# Patient Record
Sex: Female | Born: 1985 | Race: White | Hispanic: No | Marital: Married | Smoking: Former smoker
Health system: Southern US, Community
[De-identification: ages and names within clinical notes are randomized; demographics above are authoritative.]

## PROBLEM LIST (undated history)

## (undated) DIAGNOSIS — R51 Headache: Secondary | ICD-10-CM

## (undated) DIAGNOSIS — M545 Low back pain, unspecified: Secondary | ICD-10-CM

## (undated) DIAGNOSIS — K900 Celiac disease: Secondary | ICD-10-CM

## (undated) DIAGNOSIS — F329 Major depressive disorder, single episode, unspecified: Secondary | ICD-10-CM

## (undated) DIAGNOSIS — M539 Dorsopathy, unspecified: Secondary | ICD-10-CM

## (undated) DIAGNOSIS — M549 Dorsalgia, unspecified: Secondary | ICD-10-CM

## (undated) DIAGNOSIS — M5430 Sciatica, unspecified side: Secondary | ICD-10-CM

## (undated) DIAGNOSIS — Z8632 Personal history of gestational diabetes: Secondary | ICD-10-CM

## (undated) DIAGNOSIS — F419 Anxiety disorder, unspecified: Secondary | ICD-10-CM

## (undated) DIAGNOSIS — F32A Depression, unspecified: Secondary | ICD-10-CM

## (undated) DIAGNOSIS — G43909 Migraine, unspecified, not intractable, without status migrainosus: Secondary | ICD-10-CM

## (undated) DIAGNOSIS — E039 Hypothyroidism, unspecified: Secondary | ICD-10-CM

## (undated) DIAGNOSIS — S46009A Unspecified injury of muscle(s) and tendon(s) of the rotator cuff of unspecified shoulder, initial encounter: Secondary | ICD-10-CM

## (undated) DIAGNOSIS — F1910 Other psychoactive substance abuse, uncomplicated: Secondary | ICD-10-CM

## (undated) DIAGNOSIS — J45909 Unspecified asthma, uncomplicated: Secondary | ICD-10-CM

## (undated) DIAGNOSIS — M654 Radial styloid tenosynovitis [de Quervain]: Secondary | ICD-10-CM

## (undated) DIAGNOSIS — M1990 Unspecified osteoarthritis, unspecified site: Secondary | ICD-10-CM

## (undated) HISTORY — DX: Sciatica, unspecified side: M54.30

## (undated) HISTORY — DX: Celiac disease: K90.0

## (undated) HISTORY — PX: IUD REMOVAL: SHX5392

## (undated) HISTORY — DX: Low back pain, unspecified: M54.50

## (undated) HISTORY — DX: Major depressive disorder, single episode, unspecified: F32.9

## (undated) HISTORY — DX: Dorsopathy, unspecified: M53.9

## (undated) HISTORY — DX: Dorsalgia, unspecified: M54.9

## (undated) HISTORY — DX: Unspecified injury of muscle(s) and tendon(s) of the rotator cuff of unspecified shoulder, initial encounter: S46.009A

## (undated) HISTORY — DX: Migraine, unspecified, not intractable, without status migrainosus: G43.909

## (undated) HISTORY — DX: Radial styloid tenosynovitis (de quervain): M65.4

## (undated) HISTORY — DX: Other psychoactive substance abuse, uncomplicated: F19.10

## (undated) HISTORY — DX: Unspecified osteoarthritis, unspecified site: M19.90

## (undated) HISTORY — DX: Depression, unspecified: F32.A

## (undated) HISTORY — DX: Personal history of gestational diabetes: Z86.32

## (undated) HISTORY — PX: WISDOM TOOTH EXTRACTION: SHX21

## (undated) HISTORY — PX: WRIST SURGERY: SHX841

---

## 2012-07-27 LAB — OB RESULTS CONSOLE GBS: GBS: NEGATIVE

## 2012-07-27 LAB — OB RESULTS CONSOLE HIV ANTIBODY (ROUTINE TESTING): HIV: NONREACTIVE

## 2012-07-27 LAB — OB RESULTS CONSOLE ABO/RH: RH Type: POSITIVE

## 2012-08-24 ENCOUNTER — Inpatient Hospital Stay (HOSPITAL_COMMUNITY): Admission: AD | Admit: 2012-08-24 | Payer: Self-pay | Source: Ambulatory Visit | Admitting: Obstetrics and Gynecology

## 2012-09-16 ENCOUNTER — Other Ambulatory Visit: Payer: Self-pay | Admitting: Obstetrics and Gynecology

## 2012-11-02 ENCOUNTER — Encounter (HOSPITAL_COMMUNITY): Payer: Self-pay | Admitting: Pharmacist

## 2012-11-11 ENCOUNTER — Encounter (HOSPITAL_COMMUNITY): Payer: Self-pay

## 2012-11-14 ENCOUNTER — Encounter (HOSPITAL_COMMUNITY)
Admission: RE | Admit: 2012-11-14 | Discharge: 2012-11-14 | Disposition: A | Discharge status: Home / Self Care | Payer: No Typology Code available for payment source | Source: Ambulatory Visit | Attending: Obstetrics and Gynecology | Admitting: Obstetrics and Gynecology

## 2012-11-14 ENCOUNTER — Inpatient Hospital Stay (HOSPITAL_COMMUNITY)
Admission: RE | Admit: 2012-11-14 | Discharge: 2012-11-18 | DRG: 766 | Disposition: A | Discharge status: Home / Self Care | Payer: No Typology Code available for payment source | Source: Ambulatory Visit | Attending: Obstetrics and Gynecology | Admitting: Obstetrics and Gynecology

## 2012-11-14 ENCOUNTER — Encounter (HOSPITAL_COMMUNITY): Payer: Self-pay

## 2012-11-14 DIAGNOSIS — O34219 Maternal care for unspecified type scar from previous cesarean delivery: Principal | ICD-10-CM | POA: Diagnosis present

## 2012-11-14 DIAGNOSIS — Z98891 History of uterine scar from previous surgery: Secondary | ICD-10-CM

## 2012-11-14 HISTORY — DX: Anxiety disorder, unspecified: F41.9

## 2012-11-14 HISTORY — DX: Unspecified asthma, uncomplicated: J45.909

## 2012-11-14 HISTORY — DX: Hypothyroidism, unspecified: E03.9

## 2012-11-14 HISTORY — DX: Headache: R51

## 2012-11-14 LAB — CBC WITH DIFFERENTIAL/PLATELET
Basophils Relative: 0 % (ref 0–1)
HCT: 36.9 % (ref 36.0–46.0)
Hemoglobin: 12.7 g/dL (ref 12.0–15.0)
Lymphocytes Relative: 27 % (ref 12–46)
Lymphs Abs: 2.9 10*3/uL (ref 0.7–4.0)
MCHC: 34.4 g/dL (ref 30.0–36.0)
Monocytes Absolute: 1.1 10*3/uL — ABNORMAL HIGH (ref 0.1–1.0)
Monocytes Relative: 10 % (ref 3–12)
Neutro Abs: 6.5 10*3/uL (ref 1.7–7.7)
Neutrophils Relative %: 62 % (ref 43–77)
RBC: 4.17 MIL/uL (ref 3.87–5.11)

## 2012-11-14 LAB — COMPREHENSIVE METABOLIC PANEL
Alkaline Phosphatase: 194 U/L — ABNORMAL HIGH (ref 39–117)
BUN: 5 mg/dL — ABNORMAL LOW (ref 6–23)
CO2: 22 mEq/L (ref 19–32)
Chloride: 103 mEq/L (ref 96–112)
Creatinine, Ser: 0.66 mg/dL (ref 0.50–1.10)
GFR calc Af Amer: >90 mL/min (ref >90)
GFR calc non Af Amer: >90 mL/min (ref >90)
Glucose, Bld: 93 mg/dL (ref 70–99)
Potassium: 3.8 mEq/L (ref 3.5–5.1)
Total Bilirubin: 0.3 mg/dL (ref 0.3–1.2)

## 2012-11-14 LAB — URINALYSIS, ROUTINE W REFLEX MICROSCOPIC
Glucose, UA: NEGATIVE mg/dL
Ketones, ur: NEGATIVE mg/dL
Protein, ur: NEGATIVE mg/dL
Urobilinogen, UA: 0.2 mg/dL (ref 0.0–1.0)

## 2012-11-14 LAB — ABO/RH: ABO/RH(D): B POS

## 2012-11-14 LAB — URINE MICROSCOPIC-ADD ON

## 2012-11-14 LAB — TYPE AND SCREEN: ABO/RH(D): B POS

## 2012-11-14 LAB — RPR: RPR Ser Ql: NONREACTIVE

## 2012-11-14 NOTE — Patient Instructions (Addendum)
Your procedure is scheduled on:11/16/12  Enter through the Main Entrance at :0730am Pick up desk phone and dial [phone] and inform us of your arrival.  Please call [phone] if you have any problems the morning of surgery.  Remember: Do not eat food or drink liquids, including water, after midnight:Tuesday    You may brush your teeth the morning of surgery.   DO NOT wear jewelry, eye make-up, lipstick,body lotion, or dark fingernail polish.  (Polished toes are ok) You may wear deodorant.  If you are to be admitted after surgery, leave suitcase in car until your room has been assigned.

## 2012-11-16 ENCOUNTER — Encounter (HOSPITAL_COMMUNITY): Payer: Self-pay | Admitting: Anesthesiology

## 2012-11-16 ENCOUNTER — Inpatient Hospital Stay (HOSPITAL_COMMUNITY): Payer: No Typology Code available for payment source | Admitting: Anesthesiology

## 2012-11-16 ENCOUNTER — Encounter (HOSPITAL_COMMUNITY)
Admission: RE | Disposition: A | Discharge status: Home / Self Care | Payer: Self-pay | Source: Ambulatory Visit | Attending: Obstetrics and Gynecology

## 2012-11-16 SURGERY — Surgical Case
Anesthesia: Spinal | Wound class: Clean Contaminated

## 2012-11-16 MED ORDER — MEASLES, MUMPS & RUBELLA VAC ~~LOC~~ INJ: 0.5000 mL | INJECTION | Freq: Once | SUBCUTANEOUS | Status: DC

## 2012-11-16 MED ORDER — SIMETHICONE 80 MG PO CHEW: 80.0000 mg | CHEWABLE_TABLET | ORAL | Status: DC | PRN

## 2012-11-16 MED ORDER — SIMETHICONE 80 MG PO CHEW
80.0000 mg | CHEWABLE_TABLET | Freq: Three times a day (TID) | ORAL | Status: DC
  Administered 2012-11-16 – 2012-11-17 (×7): 80 mg via ORAL

## 2012-11-16 MED ORDER — FENTANYL CITRATE 0.05 MG/ML IJ SOLN: 25.0000 ug | INTRAMUSCULAR | Status: DC | PRN

## 2012-11-16 MED ORDER — ONDANSETRON HCL 4 MG/2ML IJ SOLN: 4.0000 mg | INTRAMUSCULAR | Status: DC | PRN

## 2012-11-16 MED ORDER — SODIUM CHLORIDE 0.9 % IJ SOLN: 3.0000 mL | INTRAMUSCULAR | Status: DC | PRN

## 2012-11-16 MED ORDER — LANOLIN HYDROUS EX OINT: 1.0000 "application " | TOPICAL_OINTMENT | CUTANEOUS | Status: DC | PRN

## 2012-11-16 MED ORDER — LACTATED RINGERS IV SOLN
40.0000 [IU] | INTRAVENOUS | Status: DC | PRN
  Administered 2012-11-16: 40 [IU] via INTRAVENOUS

## 2012-11-16 MED ORDER — DIPHENHYDRAMINE HCL 50 MG/ML IJ SOLN
INTRAMUSCULAR | Status: AC
  Filled 2012-11-16: qty 1

## 2012-11-16 MED ORDER — DIPHENHYDRAMINE HCL 50 MG/ML IJ SOLN
INTRAMUSCULAR | Status: DC | PRN
  Administered 2012-11-16 (×2): 25 mg via INTRAVENOUS

## 2012-11-16 MED ORDER — MORPHINE SULFATE (PF) 0.5 MG/ML IJ SOLN
INTRAMUSCULAR | Status: DC | PRN
  Administered 2012-11-16: .15 mg via INTRATHECAL

## 2012-11-16 MED ORDER — DIPHENHYDRAMINE HCL 50 MG/ML IJ SOLN: 12.5000 mg | INTRAMUSCULAR | Status: DC | PRN

## 2012-11-16 MED ORDER — MENTHOL 3 MG MT LOZG: 1.0000 | LOZENGE | OROMUCOSAL | Status: DC | PRN

## 2012-11-16 MED ORDER — SENNOSIDES-DOCUSATE SODIUM 8.6-50 MG PO TABS
2.0000 | ORAL_TABLET | Freq: Every day | ORAL | Status: DC
  Administered 2012-11-16 – 2012-11-17 (×2): 2 via ORAL

## 2012-11-16 MED ORDER — SCOPOLAMINE 1 MG/3DAYS TD PT72
MEDICATED_PATCH | TRANSDERMAL | Status: AC
  Administered 2012-11-16: 1.5 mg via TRANSDERMAL
  Filled 2012-11-16: qty 1

## 2012-11-16 MED ORDER — SCOPOLAMINE 1 MG/3DAYS TD PT72: 1.0000 | MEDICATED_PATCH | Freq: Once | TRANSDERMAL | Status: DC

## 2012-11-16 MED ORDER — WITCH HAZEL-GLYCERIN EX PADS: 1.0000 "application " | MEDICATED_PAD | CUTANEOUS | Status: DC | PRN

## 2012-11-16 MED ORDER — DIBUCAINE 1 % RE OINT: 1.0000 "application " | TOPICAL_OINTMENT | RECTAL | Status: DC | PRN

## 2012-11-16 MED ORDER — NALOXONE HCL 0.4 MG/ML IJ SOLN: 0.4000 mg | INTRAMUSCULAR | Status: DC | PRN

## 2012-11-16 MED ORDER — PRENATAL MULTIVITAMIN CH
1.0000 | ORAL_TABLET | Freq: Every day | ORAL | Status: DC
  Administered 2012-11-17: 1 via ORAL
  Filled 2012-11-16: qty 1

## 2012-11-16 MED ORDER — IBUPROFEN 600 MG PO TABS
600.0000 mg | ORAL_TABLET | Freq: Four times a day (QID) | ORAL | Status: DC
  Administered 2012-11-16 – 2012-11-18 (×8): 600 mg via ORAL
  Filled 2012-11-16 (×7): qty 1

## 2012-11-16 MED ORDER — NALBUPHINE HCL 10 MG/ML IJ SOLN
5.0000 mg | INTRAMUSCULAR | Status: DC | PRN
  Filled 2012-11-16: qty 1

## 2012-11-16 MED ORDER — BUPIVACAINE IN DEXTROSE 0.75-8.25 % IT SOLN
INTRATHECAL | Status: DC | PRN
  Administered 2012-11-16: 12.5 mg via INTRATHECAL

## 2012-11-16 MED ORDER — CEFAZOLIN SODIUM-DEXTROSE 2-3 GM-% IV SOLR
2.0000 g | INTRAVENOUS | Status: AC
  Administered 2012-11-16: 2 g via INTRAVENOUS

## 2012-11-16 MED ORDER — ONDANSETRON HCL 4 MG/2ML IJ SOLN
INTRAMUSCULAR | Status: AC
  Filled 2012-11-16: qty 2

## 2012-11-16 MED ORDER — DIPHENHYDRAMINE HCL 25 MG PO CAPS
25.0000 mg | ORAL_CAPSULE | ORAL | Status: DC | PRN
  Administered 2012-11-16: 25 mg via ORAL
  Filled 2012-11-16 (×2): qty 1

## 2012-11-16 MED ORDER — PHENYLEPHRINE HCL 10 MG/ML IJ SOLN
INTRAMUSCULAR | Status: DC | PRN
  Administered 2012-11-16: 80 ug via INTRAVENOUS

## 2012-11-16 MED ORDER — MORPHINE SULFATE 0.5 MG/ML IJ SOLN
INTRAMUSCULAR | Status: AC
  Filled 2012-11-16: qty 10

## 2012-11-16 MED ORDER — EPHEDRINE 5 MG/ML INJ
INTRAVENOUS | Status: AC
  Filled 2012-11-16: qty 10

## 2012-11-16 MED ORDER — PHENYLEPHRINE 40 MCG/ML (10ML) SYRINGE FOR IV PUSH (FOR BLOOD PRESSURE SUPPORT)
PREFILLED_SYRINGE | INTRAVENOUS | Status: AC
  Filled 2012-11-16: qty 5

## 2012-11-16 MED ORDER — PNEUMOCOCCAL VAC POLYVALENT 25 MCG/0.5ML IJ INJ
0.5000 mL | INJECTION | INTRAMUSCULAR | Status: DC
  Filled 2012-11-16: qty 0.5

## 2012-11-16 MED ORDER — NALOXONE HCL 1 MG/ML IJ SOLN
1.0000 ug/kg/h | INTRAVENOUS | Status: DC | PRN
  Filled 2012-11-16: qty 2

## 2012-11-16 MED ORDER — TETANUS-DIPHTH-ACELL PERTUSSIS 5-2.5-18.5 LF-MCG/0.5 IM SUSP: 0.5000 mL | Freq: Once | INTRAMUSCULAR | Status: DC

## 2012-11-16 MED ORDER — ONDANSETRON HCL 4 MG/2ML IJ SOLN: 4.0000 mg | Freq: Three times a day (TID) | INTRAMUSCULAR | Status: DC | PRN

## 2012-11-16 MED ORDER — MEPERIDINE HCL 25 MG/ML IJ SOLN: 6.2500 mg | INTRAMUSCULAR | Status: DC | PRN

## 2012-11-16 MED ORDER — FENTANYL CITRATE 0.05 MG/ML IJ SOLN
INTRAMUSCULAR | Status: DC | PRN
  Administered 2012-11-16: 25 ug via INTRATHECAL

## 2012-11-16 MED ORDER — HYDROCODONE-ACETAMINOPHEN 5-325 MG PO TABS
1.0000 | ORAL_TABLET | ORAL | Status: DC | PRN
  Administered 2012-11-16 – 2012-11-18 (×8): 1 via ORAL
  Filled 2012-11-16 (×6): qty 1
  Filled 2012-11-16: qty 2
  Filled 2012-11-16: qty 1

## 2012-11-16 MED ORDER — ZOLPIDEM TARTRATE 5 MG PO TABS: 5.0000 mg | ORAL_TABLET | Freq: Every evening | ORAL | Status: DC | PRN

## 2012-11-16 MED ORDER — OXYTOCIN 10 UNIT/ML IJ SOLN
INTRAMUSCULAR | Status: AC
  Filled 2012-11-16: qty 4

## 2012-11-16 MED ORDER — ONDANSETRON HCL 4 MG PO TABS: 4.0000 mg | ORAL_TABLET | ORAL | Status: DC | PRN

## 2012-11-16 MED ORDER — METOCLOPRAMIDE HCL 5 MG/ML IJ SOLN: 10.0000 mg | Freq: Three times a day (TID) | INTRAMUSCULAR | Status: DC | PRN

## 2012-11-16 MED ORDER — CEFAZOLIN SODIUM 1-5 GM-% IV SOLN
1.0000 g | Freq: Three times a day (TID) | INTRAVENOUS | Status: AC
  Administered 2012-11-16 (×2): 1 g via INTRAVENOUS
  Filled 2012-11-16 (×2): qty 50

## 2012-11-16 MED ORDER — FENTANYL CITRATE 0.05 MG/ML IJ SOLN
INTRAMUSCULAR | Status: AC
  Filled 2012-11-16: qty 2

## 2012-11-16 MED ORDER — DIPHENHYDRAMINE HCL 50 MG/ML IJ SOLN: 25.0000 mg | INTRAMUSCULAR | Status: DC | PRN

## 2012-11-16 MED ORDER — EPHEDRINE SULFATE 50 MG/ML IJ SOLN
INTRAMUSCULAR | Status: DC | PRN
  Administered 2012-11-16 (×3): 10 mg via INTRAVENOUS

## 2012-11-16 MED ORDER — 0.9 % SODIUM CHLORIDE (POUR BTL) OPTIME
TOPICAL | Status: DC | PRN
  Administered 2012-11-16: 1000 mL

## 2012-11-16 MED ORDER — LACTATED RINGERS IV SOLN
INTRAVENOUS | Status: DC
  Administered 2012-11-16 (×3): via INTRAVENOUS

## 2012-11-16 MED ORDER — OXYTOCIN 40 UNITS IN LACTATED RINGERS INFUSION - SIMPLE MED: 62.5000 mL/h | INTRAVENOUS | Status: AC

## 2012-11-16 MED ORDER — ONDANSETRON HCL 4 MG/2ML IJ SOLN
INTRAMUSCULAR | Status: DC | PRN
  Administered 2012-11-16: 4 mg via INTRAVENOUS

## 2012-11-16 MED ORDER — NALBUPHINE HCL 10 MG/ML IJ SOLN
5.0000 mg | INTRAMUSCULAR | Status: DC | PRN
  Administered 2012-11-16: 10 mg via INTRAVENOUS
  Filled 2012-11-16 (×2): qty 1

## 2012-11-16 MED ORDER — DIPHENHYDRAMINE HCL 25 MG PO CAPS: 25.0000 mg | ORAL_CAPSULE | Freq: Four times a day (QID) | ORAL | Status: DC | PRN

## 2012-11-16 MED ORDER — CEFAZOLIN SODIUM-DEXTROSE 2-3 GM-% IV SOLR
INTRAVENOUS | Status: AC
  Filled 2012-11-16: qty 50

## 2012-11-16 MED ORDER — LACTATED RINGERS IV SOLN
INTRAVENOUS | Status: AC
  Administered 2012-11-16: 15:00:00 via INTRAVENOUS

## 2012-11-16 SURGICAL SUPPLY — 36 items
CLAMP CORD UMBIL (MISCELLANEOUS) IMPLANT
CLOTH BEACON ORANGE TIMEOUT ST (SAFETY) ×3 IMPLANT
CONTAINER PREFILL 10% NBF 15ML (MISCELLANEOUS) IMPLANT
DRAPE LG THREE QUARTER DISP (DRAPES) ×3 IMPLANT
DRSG OPSITE POSTOP 4X10 (GAUZE/BANDAGES/DRESSINGS) ×3 IMPLANT
DRSG VASELINE 3X18 (GAUZE/BANDAGES/DRESSINGS) ×3 IMPLANT
DURAPREP 26ML APPLICATOR (WOUND CARE) ×3 IMPLANT
ELECT REM PT RETURN 9FT ADLT (ELECTROSURGICAL) ×3
ELECTRODE REM PT RTRN 9FT ADLT (ELECTROSURGICAL) ×2 IMPLANT
EXTRACTOR VACUUM KIWI (MISCELLANEOUS) IMPLANT
EXTRACTOR VACUUM M CUP 4 TUBE (SUCTIONS) IMPLANT
GLOVE BIO SURGEON STRL SZ 6.5 (GLOVE) ×3 IMPLANT
GLOVE BIO SURGEON STRL SZ7.5 (GLOVE) ×3 IMPLANT
GLOVE BIOGEL PI IND STRL 7.0 (GLOVE) ×8 IMPLANT
GLOVE BIOGEL PI INDICATOR 7.0 (GLOVE) ×4
GLOVE ECLIPSE 6.5 STRL STRAW (GLOVE) ×3 IMPLANT
GOWN PREVENTION PLUS XLARGE (GOWN DISPOSABLE) ×3 IMPLANT
GOWN STRL REIN XL XLG (GOWN DISPOSABLE) ×6 IMPLANT
KIT ABG SYR 3ML LUER SLIP (SYRINGE) IMPLANT
NEEDLE HYPO 25X5/8 SAFETYGLIDE (NEEDLE) IMPLANT
NS IRRIG 1000ML POUR BTL (IV SOLUTION) ×3 IMPLANT
PACK C SECTION WH (CUSTOM PROCEDURE TRAY) ×3 IMPLANT
PAD OB MATERNITY 4.3X12.25 (PERSONAL CARE ITEMS) ×3 IMPLANT
RTRCTR C-SECT PINK 25CM LRG (MISCELLANEOUS) IMPLANT
STAPLER VISISTAT 35W (STAPLE) ×3 IMPLANT
SUT PLAIN 0 NONE (SUTURE) IMPLANT
SUT VIC AB 0 CT1 36 (SUTURE) ×24 IMPLANT
SUT VIC AB 3-0 CTX 36 (SUTURE) ×3 IMPLANT
SUT VIC AB 3-0 FS2 27 (SUTURE) ×3 IMPLANT
SUT VIC AB 3-0 SH 27 (SUTURE)
SUT VIC AB 3-0 SH 27X BRD (SUTURE) IMPLANT
SUT VIC AB 4-0 KS 27 (SUTURE) IMPLANT
SUT VICRYL 0 TIES 12 18 (SUTURE) IMPLANT
TOWEL OR 17X24 6PK STRL BLUE (TOWEL DISPOSABLE) ×9 IMPLANT
TRAY FOLEY CATH 14FR (SET/KITS/TRAYS/PACK) ×3 IMPLANT
WATER STERILE IRR 1000ML POUR (IV SOLUTION) ×3 IMPLANT

## 2012-11-16 NOTE — Op Note (Signed)
[name], [name] NO.:  [id number]  MEDICAL RECORD NO.:  [id number]  LOCATION:  [facility]                          FACILITY:  [facility]  PHYSICIAN:  Malachi Pro. Ambrose Mantle, M.D. DATE OF BIRTH:  1985/12/12  DATE OF PROCEDURE:  11/16/2012 DATE OF DISCHARGE:                              OPERATIVE REPORT   PREOPERATIVE DIAGNOSIS:  Intrauterine pregnancy, 39+ weeks, prior C- section declined VBAC.  POSTOPERATIVE DIAGNOSIS:  Intrauterine pregnancy, 39+ weeks, prior C- section declined VBAC.  OPERATION:  Low-transverse cervical C-section.  SURGEON:  Malachi Pro. Ambrose Mantle, M.D.  ASSISTANT:  Sherron Monday, MD  ANESTHESIA:  Spinal anesthesia by Dr. Malen Gauze.  PROCEDURE IN DETAIL:  The patient was brought to the operating room and given a spinal anesthetic by Dr. Malen Gauze.  She was placed on the table supine.  Blood pressure was monitored.  The urethra was prepped and a Foley catheter was inserted to straight drain.  The abdomen was prepped with DuraPrep.  A time-out was done.  After the DuraPrep had dried for 3 minutes, an abdominal drape was placed.  Anesthesia was confirmed by pinching the lower abdomen with an Allis clamp.  The old incision was utilized to go through the skin, subcutaneous tissue, and fascia. Fascia was separated from the rectus muscles superiorly and inferiorly. Rectus muscle was already split in the midline.  Peritoneum was opened vertically.  A bowel for retractor was used to expose the lower uterine segment as well as a Editor, commissioning.  There were some omental adhesions to the abdominal wall at the upper end of the incision.  I made a short transverse incision through the superficial layers of the myometrium in the lower uterine segment, exposed the amniotic sac, entered the amniotic sac with my finger, ruptured the membranes with clear fluid, pulled superiorly and inferiorly to enlarge the incision and with fundal pressure delivered the vertex through the  incisional opening.  I suctioned the nose and mouth with a bulb, delivered the rest of the baby, clamped the cord.  Dr. Ellyn Hack gave the baby to the awaiting neonatologist who assigned Apgars of 8 and 9 at 1 and 5 minutes to the female infant.  The weight has not been done.  Cord blood was obtained. The placenta was removed intact.  The inside of the uterus was inspected and found to be free of debris.  The cervix was dilated with a ring forceps.  The uterine angles were identified and clamped with ring forceps.  The uterine incision was closed with 2 running sutures of 0 Vicryl locking the first layer, nonlocking on the second layer.  The bladder was kept well free of the operative field.  The uterus, both tubes and ovaries appeared normal.  Liberal irrigation confirmed hemostasis.  The retractors were removed and the abdominal wall was closed in layers using interrupted sutures of 0 Vicryl to close the rectus muscle and peritoneum in 1 layer, 2 running sutures of 0 Vicryl on the fascia, running 3-0 Vicryl on the subcutaneous tissue and staples on the skin.  Entering the abdominal cavity, it should be noted there was a brisk bleeder from the rectus muscle on the right angle but  this was controlled with Bovie.  The patient seemed to tolerate the procedure well.  Blood loss was estimated at 1000 mL.  Sponge and needle counts were correct and she was returned to recovery in satisfactory condition.     Malachi Pro. Ambrose Mantle, M.D.     TFH/MEDQ  D:  11/16/2012  T:  11/16/2012  Job:  [id number]

## 2012-11-16 NOTE — Progress Notes (Signed)
Patient ID: [name], female   DOB: Apr 10, 1986, 27 y.o.   MRN: [id number] I examined this lady yesterday and she reports no change in her health since that time.
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
## 2012-11-16 NOTE — Transfer of Care (Signed)
Immediate Anesthesia Transfer of Care Note  Patient: [name]  Procedure(s) Performed: Procedure(s) with comments: CESAREAN SECTION (N/A) - REPEAT  Patient Location: PACU  Anesthesia Type:Spinal  Level of Consciousness: awake, alert  and oriented  Airway & Oxygen Therapy: Patient Spontanous Breathing  Post-op Assessment: Report given to PACU RN  Post vital signs: Reviewed  Complications: No apparent anesthesia complications

## 2012-11-16 NOTE — Anesthesia Procedure Notes (Signed)
Spinal  Patient location during procedure: OR Start time: 11/16/2012 9:04 AM Staffing Anesthesiologist: Jacquiline Zurcher A. Performed by: anesthesiologist  Preanesthetic Checklist Completed: patient identified, site marked, surgical consent, pre-op evaluation, timeout performed, IV checked, risks and benefits discussed and monitors and equipment checked Spinal Block Patient position: sitting Prep: site prepped and draped and DuraPrep Patient monitoring: heart rate, cardiac monitor, continuous pulse ox and blood pressure Approach: midline Location: L3-4 Injection technique: single-shot Needle Needle type: Sprotte  Needle gauge: 24 G Needle length: 9 cm Needle insertion depth: 6 cm Assessment Sensory level: T4 Additional Notes Patient tolerated procedure well. Adequate sensory level.

## 2012-11-16 NOTE — OR Nursing (Signed)
11/16/2012 History and physical is on the paper chart.

## 2012-11-16 NOTE — Preoperative (Signed)
Beta Blockers   Reason not to administer Beta Blockers:Not Applicable 

## 2012-11-16 NOTE — Anesthesia Postprocedure Evaluation (Signed)
  Anesthesia Post-op Note  Patient: [name]  Procedure(s) Performed: Procedure(s) with comments: CESAREAN SECTION (N/A) - REPEAT  Patient Location: PACU  Anesthesia Type:Spinal  Level of Consciousness: awake, alert  and oriented  Airway and Oxygen Therapy: Patient Spontanous Breathing  Post-op Pain: none  Post-op Assessment: Post-op Vital signs reviewed, Patient's Cardiovascular Status Stable, Respiratory Function Stable, Patent Airway, No signs of Nausea or vomiting, Pain level controlled, No headache and No backache  Post-op Vital Signs: Reviewed and stable  Complications: No apparent anesthesia complications

## 2012-11-16 NOTE — Anesthesia Postprocedure Evaluation (Signed)
  Anesthesia Post-op Note  Patient: [name]  Procedure(s) Performed: Procedure(s) with comments: CESAREAN SECTION (N/A) - REPEAT  Patient Location: PACU and Mother/Baby  Anesthesia Type:Spinal  Level of Consciousness: awake, alert  and oriented  Airway and Oxygen Therapy: Patient Spontanous Breathing  Post-op Pain: mild  Post-op Assessment: Patient's Cardiovascular Status Stable, Respiratory Function Stable, No signs of Nausea or vomiting and Pain level controlled  Post-op Vital Signs: stable  Complications: No apparent anesthesia complications

## 2012-11-16 NOTE — Anesthesia Preprocedure Evaluation (Addendum)
Anesthesia Evaluation  Patient identified by MRN, date of birth, ID band Patient awake    Reviewed: Allergy & Precautions, H&P , NPO status , Patient's Chart, lab work & pertinent test results  Airway Mallampati: III TM Distance: >3 FB Neck ROM: Full    Dental no notable dental hx. (+) Teeth Intact   Pulmonary asthma , former smoker,  breath sounds clear to auscultation  Pulmonary exam normal       Cardiovascular negative cardio ROS  Rhythm:Regular Rate:Normal     Neuro/Psych  Headaches, Anxiety    GI/Hepatic Neg liver ROS, GERD-  Medicated,  Endo/Other  Hypothyroidism Morbid obesity  Renal/GU negative Renal ROS  negative genitourinary   Musculoskeletal negative musculoskeletal ROS (+)   Abdominal (+) + obese,   Peds  Hematology negative hematology ROS (+)   Anesthesia Other Findings   Reproductive/Obstetrics (+) Pregnancy Previous C/Section                          Anesthesia Physical Anesthesia Plan  ASA: III  Anesthesia Plan: Spinal   Post-op Pain Management:    Induction: Intravenous  Airway Management Planned: Oral ETT  Additional Equipment:   Intra-op Plan:   Post-operative Plan: Extubation in OR  Informed Consent: I have reviewed the patients History and Physical, chart, labs and discussed the procedure including the risks, benefits and alternatives for the proposed anesthesia with the patient or authorized representative who has indicated his/her understanding and acceptance.   Dental advisory given  Plan Discussed with: CRNA, Anesthesiologist and Surgeon  Anesthesia Plan Comments:         Anesthesia Quick Evaluation

## 2012-11-17 ENCOUNTER — Encounter (HOSPITAL_COMMUNITY): Payer: Self-pay | Admitting: Obstetrics and Gynecology

## 2012-11-17 LAB — CBC
MCH: 29.5 pg (ref 26.0–34.0)
MCHC: 33.6 g/dL (ref 30.0–36.0)
Platelets: 182 10*3/uL (ref 150–400)
RDW: 13.5 % (ref 11.5–15.5)

## 2012-11-17 LAB — BIRTH TISSUE RECOVERY COLLECTION (PLACENTA DONATION)

## 2012-11-17 NOTE — Progress Notes (Signed)
Patient ID: [name], female   DOB: 1985/05/14, 27 y.o.   MRN: [id number] #1 afebrile BP normal HGB stable Tolerating a diet and voiding well

## 2012-11-18 MED ORDER — HYDROCODONE-ACETAMINOPHEN 5-325 MG PO TABS: 1.0000 | ORAL_TABLET | Freq: Four times a day (QID) | ORAL | Status: DC | PRN

## 2012-11-18 MED ORDER — IBUPROFEN 600 MG PO TABS: 600.0000 mg | ORAL_TABLET | Freq: Four times a day (QID) | ORAL | Status: DC | PRN

## 2012-11-18 NOTE — Discharge Summary (Signed)
[name], [name] NO.:  [id number]  MEDICAL RECORD NO.:  [id number]  LOCATION:  9132                          [facility]ACILITY:  [facility]  PHYSICIAN:  Malachi Pro. Ambrose Mantle, M.D. DATE OF BIRTH:  [birth year]  DATE OF ADMISSION:  11/16/2012 DATE OF DISCHARGE:  11/18/2012                              DISCHARGE SUMMARY   This 27 year old white female, para 1-0-1-1, gravida 2, [facility] November 22, 2012, admitted for repeat C-section after a prior C-section and declined vaginal birth.  Prenatal labs were normal except she declined first trimester screen, cystic fibrosis screen and AFP.  Group B strep was negative.  One hour Glucola 123.  Prenatal course essentially uncomplicated.  She was admitted, given a spinal anesthetic by Dr. Malen Gauze and underwent a low-transverse cervical C-section by Dr. Ambrose Mantle with Dr. Ellyn Hack assisting, did well postoperatively.  Baby was female 8 pounds 5 ounces.  Apgars 8 and 9 at 1 and 5 minutes.  On the second postpartum day, she was afebrile,  tolerating a regular diet, passing flatus, voiding well without difficulty and ambulating well, and she was ready for discharge.  Hemoglobin on admission 11.3, hematocrit 33.6, white count 10,600.  Followup hemoglobin was 9.  FINAL DIAGNOSES:  Intrauterine pregnancy at 39 plus weeks, delivered vertex by C-section,  OPERATION:  Low-transverse cervical C-section.  FINAL CONDITION:  Improved.  INSTRUCTIONS:  Include our regular discharge instruction booklet as well as the after visit summary.  Prescriptions for Motrin 600 mg 30 tablets, 1 every 6 hours as needed for pain and Vicodin 5/300, 30 tablets, 1 every 6 hours as needed for pain are given at discharge.  She is advised to continue her prenatal vitamins and return to the office in probably 5 days or so to have her staples removed.     Malachi Pro. Ambrose Mantle, M.D.     TFH/MEDQ  D:  11/18/2012  T:  11/18/2012  Job:  [id number]

## 2012-11-18 NOTE — Progress Notes (Signed)
Patient ID: [name], female   DOB: Dec 01, 1985, 27 y.o.   MRN: [id number] #[id number] afebrile BP normal Tolerating a diet , passing flatus, voiding well, ambulating well. She has no complaints and is ready for d/c.

## 2012-11-18 NOTE — Plan of Care (Signed)
Problem: Discharge Progression Outcomes Goal: Remove staples per MD order Outcome: Not Met (add Reason) Staple removal in office next week     

## 2012-11-25 DEATH — deceased

## 2014-02-26 ENCOUNTER — Encounter (HOSPITAL_COMMUNITY): Payer: Self-pay | Admitting: Obstetrics and Gynecology

## 2016-06-07 ENCOUNTER — Encounter (HOSPITAL_COMMUNITY): Payer: Self-pay | Admitting: Emergency Medicine

## 2016-06-07 ENCOUNTER — Ambulatory Visit (HOSPITAL_COMMUNITY)
Admission: EM | Admit: 2016-06-07 | Discharge: 2016-06-07 | Disposition: A | Discharge status: Home / Self Care | Payer: Self-pay | Attending: Internal Medicine | Admitting: Internal Medicine

## 2016-06-07 DIAGNOSIS — B9789 Other viral agents as the cause of diseases classified elsewhere: Secondary | ICD-10-CM

## 2016-06-07 DIAGNOSIS — J069 Acute upper respiratory infection, unspecified: Secondary | ICD-10-CM

## 2016-06-07 DIAGNOSIS — J329 Chronic sinusitis, unspecified: Secondary | ICD-10-CM

## 2016-06-07 DIAGNOSIS — J4541 Moderate persistent asthma with (acute) exacerbation: Secondary | ICD-10-CM

## 2016-06-07 MED ORDER — IPRATROPIUM-ALBUTEROL 0.5-2.5 (3) MG/3ML IN SOLN
3.0000 mL | Freq: Once | RESPIRATORY_TRACT | Status: AC
  Administered 2016-06-07: 3 mL via RESPIRATORY_TRACT

## 2016-06-07 MED ORDER — TRIAMCINOLONE ACETONIDE 40 MG/ML IJ SUSP
40.0000 mg | Freq: Once | INTRAMUSCULAR | Status: AC
  Administered 2016-06-07: 40 mg via INTRAMUSCULAR

## 2016-06-07 MED ORDER — PREDNISONE 50 MG PO TABS: ORAL_TABLET | ORAL | 0 refills | Status: DC

## 2016-06-07 MED ORDER — ALBUTEROL SULFATE HFA 108 (90 BASE) MCG/ACT IN AERS: 2.0000 | INHALATION_SPRAY | RESPIRATORY_TRACT | 0 refills | Status: DC | PRN

## 2016-06-07 MED ORDER — IPRATROPIUM-ALBUTEROL 0.5-2.5 (3) MG/3ML IN SOLN
RESPIRATORY_TRACT | Status: AC
  Filled 2016-06-07: qty 3

## 2016-06-07 MED ORDER — TRIAMCINOLONE ACETONIDE 40 MG/ML IJ SUSP
INTRAMUSCULAR | Status: AC
  Filled 2016-06-07: qty 1

## 2016-06-07 NOTE — ED Triage Notes (Signed)
[facility] with a complaint of sinus pain and pressure that started yesterday and a cough that is productive for 2 weeks.

## 2016-06-07 NOTE — ED Provider Notes (Signed)
CSN: [id number]     Arrival date & time 06/07/16  1821 History   First MD Initiated Contact with Patient 06/07/16 2011     Chief Complaint  Patient presents with  . Facial Pain   (Consider location/radiation/quality/duration/timing/severity/associated sxs/prior Treatment) 31 year old female with history of asthma who has been out of her medicines for 8 months is now complaining of a cough for 2 weeks. His also complaining of pressure behind her eyes and maxillary/frontal sinuses. She has some PND with yellow mucus. Runny nose. Denies fever or chills. She is taking ibuprofen otherwise no other medications. Current temperature 98.4. Sat equals 98%.      Past Medical History:  Diagnosis Date  . Anxiety    no meds currently  . Asthma    when pt was smoking  . Headache(784.0)   . Hypothyroidism    Past Surgical History:  Procedure Laterality Date  . CESAREAN SECTION    . CESAREAN SECTION N/A 11/16/2012   Procedure: CESAREAN SECTION;  Surgeon: Bing Plume, MD;  Location: [facility];  Service: Obstetrics;  Laterality: N/A;  REPEAT  . WISDOM TOOTH EXTRACTION    . WRIST SURGERY     History reviewed. No pertinent family history. Social History  Substance Use Topics  . Smoking status: Never Smoker  . Smokeless tobacco: Never Used  . Alcohol use No   OB History    Gravida Para Term Preterm AB Living   2 2 2     2    SAB TAB Ectopic Multiple Live Births           1     Review of Systems  Constitutional: Negative for activity change, appetite change, chills, fatigue and fever.  HENT: Positive for congestion, postnasal drip, rhinorrhea and sinus pain. Negative for ear pain and facial swelling.   Eyes: Negative.   Respiratory: Positive for cough and wheezing.   Cardiovascular: Negative.   Gastrointestinal: Negative.   Genitourinary: Negative.   Musculoskeletal: Negative for neck pain and neck stiffness.  Skin: Negative for pallor and rash.  Neurological: Negative.   All other  systems reviewed and are negative.   Allergies  Latex; Oxycodone-acetaminophen; and Sulfa antibiotics  Home Medications   Prior to Admission medications   Medication Sig Start Date End Date Taking? Authorizing Provider  albuterol (PROVENTIL HFA;VENTOLIN HFA) 108 (90 Base) MCG/ACT inhaler Inhale 2 puffs into the lungs every 4 (four) hours as needed for wheezing or shortness of breath. 06/07/16   Hayden Rasmussen, NP  predniSONE (DELTASONE) 50 MG tablet 1 tab po daily for 6 days. Take with food. 06/07/16   Hayden Rasmussen, NP   Meds Ordered and Administered this Visit   Medications  ipratropium-albuterol (DUONEB) 0.5-2.5 (3) MG/3ML nebulizer solution 3 mL (3 mLs Nebulization Given 06/07/16 2053)  triamcinolone acetonide (KENALOG-40) injection 40 mg (40 mg Intramuscular Given 06/07/16 2051)    BP 114/64 (BP Location: Right Arm)   Pulse 91   Temp 98.4 F (36.9 C) (Oral)   Resp 16   SpO2 98%   Breastfeeding? No  No data found.   Physical Exam  Constitutional: She is oriented to person, place, and time. She appears well-developed and well-nourished. No distress.  HENT:  Head: Normocephalic and atraumatic.  Right Ear: External ear normal.  Left Ear: External ear normal.  Oropharynx with minor erythema and moderate amount of clear PND. No exudates or swelling. Bilateral TMs are pearly grey transparent.  Eyes: EOM are normal. Pupils are equal, round, and reactive  to light.  Neck: Normal range of motion. Neck supple.  Cardiovascular: Normal rate, regular rhythm, normal heart sounds and intact distal pulses.   Pulmonary/Chest: Effort normal. No respiratory distress. She has no rales.  With tidal volume lungs are clear. With deeper and forced expiration and cough there is coarseness and distant wheezes bilaterally.  Musculoskeletal: Normal range of motion. She exhibits no edema.  Lymphadenopathy:    She has no cervical adenopathy.  Neurological: She is alert and oriented to person, place, and  time.  Skin: Skin is warm and dry. No rash noted.  Psychiatric: She has a normal mood and affect.  Nursing note and vitals reviewed.   Urgent Care Course     Procedures (including critical care time)  Labs Review Labs Reviewed - No data to display  Imaging Review No results found.   Visual Acuity Review  Right Eye Distance:   Left Eye Distance:   Bilateral Distance:    Right Eye Near:   Left Eye Near:    Bilateral Near:         MDM   1. Moderate persistent asthma with exacerbation   2. Rhinosinusitis   3. Viral upper respiratory tract infection   Post nebulizer and steroid injection patient states she is breathing much better. Auscultation reveals much improved air movement and significant decrease in wheezing. Use the albuterol inhaler 2 puffs every 4 hours as needed for cough and wheeze. Start taking the prednisone tomorrow daily as directed. Take with food. Be sure to drink plenty fluids and stay well-hydrated. Tylenol for pain as needed. He may take ibuprofen when you have completed taking the prednisone as needed for discomfort or fever. Below are medications listed for some of your symptoms. Sudafed PE 10 mg every 4 to 6 hours as needed for congestion Allegra or Zyrtec daily as needed for drainage and runny nose. For stronger antihistamine may take Chlor-Trimeton 2 to 4 mg every 4 to 6 hours, may cause drowsiness. Saline nasal spray used frequently. Ibuprofen 600 mg every 6 hours as needed for pain, discomfort or fever. Drink plenty of fluids and stay well-hydrated. Flonase or Rhinocort nasal spray daily Meds ordered this encounter  Medications  . ipratropium-albuterol (DUONEB) 0.5-2.5 (3) MG/3ML nebulizer solution 3 mL  . triamcinolone acetonide (KENALOG-40) injection 40 mg  . albuterol (PROVENTIL HFA;VENTOLIN HFA) 108 (90 Base) MCG/ACT inhaler    Sig: Inhale 2 puffs into the lungs every 4 (four) hours as needed for wheezing or shortness of breath.     Dispense:  1 Inhaler    Refill:  0    Order Specific Question:   Supervising Provider    Answer:   Eustace MooreMURRAY, LAURA W [[id number]]  . predniSONE (DELTASONE) 50 MG tablet    Sig: 1 tab po daily for 6 days. Take with food.    Dispense:  6 tablet    Refill:  0    Order Specific Question:   Supervising Provider    Answer:   Eustace MooreMURRAY, LAURA W [[id number]]       Hayden Rasmussenavid Cassian Torelli, NP 06/07/16 2119

## 2016-06-07 NOTE — Discharge Instructions (Signed)
Use the albuterol inhaler 2 puffs every 4 hours as needed for cough and wheeze. Start taking the prednisone tomorrow daily as directed. Take with food. Be sure to drink plenty fluids and stay well-hydrated. Tylenol for pain as needed. He may take ibuprofen when you have completed taking the prednisone as needed for discomfort or fever. Below are medications listed for some of your symptoms. Sudafed PE 10 mg every 4 to 6 hours as needed for congestion Allegra or Zyrtec daily as needed for drainage and runny nose. For stronger antihistamine may take Chlor-Trimeton 2 to 4 mg every 4 to 6 hours, may cause drowsiness. Saline nasal spray used frequently. Ibuprofen 600 mg every 6 hours as needed for pain, discomfort or fever. Drink plenty of fluids and stay well-hydrated. Flonase or Rhinocort nasal spray daily

## 2017-05-11 DIAGNOSIS — Z1389 Encounter for screening for other disorder: Secondary | ICD-10-CM | POA: Diagnosis not present

## 2017-05-11 DIAGNOSIS — Z3009 Encounter for other general counseling and advice on contraception: Secondary | ICD-10-CM | POA: Diagnosis not present

## 2017-05-11 DIAGNOSIS — Z01419 Encounter for gynecological examination (general) (routine) without abnormal findings: Secondary | ICD-10-CM | POA: Diagnosis not present

## 2017-10-06 ENCOUNTER — Ambulatory Visit: Payer: No Typology Code available for payment source | Admitting: Family Medicine

## 2018-04-13 ENCOUNTER — Encounter: Payer: Self-pay | Admitting: Family Medicine

## 2018-04-13 ENCOUNTER — Ambulatory Visit: Payer: 59 | Admitting: Family Medicine

## 2018-04-13 VITALS — BP 120/80 | Temp 98.7°F | Ht 63.5 in | Wt 205.0 lb

## 2018-04-13 DIAGNOSIS — M5432 Sciatica, left side: Secondary | ICD-10-CM

## 2018-04-13 DIAGNOSIS — F17210 Nicotine dependence, cigarettes, uncomplicated: Secondary | ICD-10-CM | POA: Diagnosis not present

## 2018-04-13 DIAGNOSIS — R635 Abnormal weight gain: Secondary | ICD-10-CM | POA: Diagnosis not present

## 2018-04-13 DIAGNOSIS — Z13220 Encounter for screening for lipoid disorders: Secondary | ICD-10-CM | POA: Diagnosis not present

## 2018-04-13 DIAGNOSIS — R5383 Other fatigue: Secondary | ICD-10-CM

## 2018-04-13 DIAGNOSIS — E7800 Pure hypercholesterolemia, unspecified: Secondary | ICD-10-CM | POA: Diagnosis not present

## 2018-04-13 DIAGNOSIS — M5430 Sciatica, unspecified side: Secondary | ICD-10-CM | POA: Insufficient documentation

## 2018-04-13 MED ORDER — METHYLPREDNISOLONE ACETATE 40 MG/ML IJ SUSP
40.0000 mg | Freq: Once | INTRAMUSCULAR | Status: AC
  Administered 2018-04-13: 40 mg via INTRAMUSCULAR

## 2018-04-13 MED ORDER — CYCLOBENZAPRINE HCL 5 MG PO TABS: 5.0000 mg | ORAL_TABLET | Freq: Three times a day (TID) | ORAL | 0 refills | Status: DC | PRN

## 2018-04-13 MED ORDER — KETOROLAC TROMETHAMINE 60 MG/2ML IM SOLN
60.0000 mg | Freq: Once | INTRAMUSCULAR | Status: AC
  Administered 2018-04-13: 60 mg via INTRAMUSCULAR

## 2018-04-13 MED ORDER — HYDROCODONE-ACETAMINOPHEN 5-325 MG PO TABS
1.0000 | ORAL_TABLET | Freq: Four times a day (QID) | ORAL | 0 refills | Status: DC | PRN
Start: 2018-04-13 — End: 2020-09-20

## 2018-04-13 MED ORDER — METHYLPREDNISOLONE 4 MG PO TBPK: ORAL_TABLET | ORAL | 0 refills | Status: DC

## 2018-04-13 NOTE — Progress Notes (Signed)
[name] is a 32 y.o. female [facility]   Patient Care Team: Helane RimaWallace, Mckynzi Cammon, DO as PCP - General (Family Medicine)   History of Present Illness:   Back Pain  This is a recurrent problem. The current episode started more than 1 year ago (but flaired over the past 2 days). The problem occurs constantly. The pain is present in the gluteal and lumbar spine. The quality of the pain is described as aching and shooting. The pain radiates to the left knee and left thigh. The pain is severe. The pain is the same all the time. The symptoms are aggravated by bending, lying down, sitting and standing. Associated symptoms include leg pain. Pertinent negatives include no abdominal pain, bladder incontinence, bowel incontinence, dysuria, fever, pelvic pain, perianal numbness or weakness. Risk factors include obesity, lack of exercise, poor posture and sedentary lifestyle. She has tried NSAIDs for the symptoms. The treatment provided no relief.   Health Maintenance Due  Topic Date Due  . PAP SMEAR-Modifier  02/21/2007  . INFLUENZA VACCINE  11/25/2017   PMHx, SurgHx, SocialHx, Medications, and Allergies were reviewed in the Visit Navigator and updated as appropriate.   Past Medical History:  Diagnosis Date  . Anxiety   . Arthritis   . Asthma   . Depression   . Headache(784.0)   . Hypothyroidism   . Migraine      Past Surgical History:  Procedure Laterality Date  . CESAREAN SECTION    . CESAREAN SECTION N/A 11/16/2012   Procedure: CESAREAN SECTION;  Surgeon: Bing Plumehomas F Henley, MD;  Location: [facility];  Service: Obstetrics;  Laterality: N/A;  REPEAT  . WISDOM TOOTH EXTRACTION    . WRIST SURGERY      Family History  Problem Relation Age of Onset  . Arthritis Mother   . Arthritis Father   . Asthma Son     Social History   Tobacco Use  . Smoking status: Current Some Day Smoker    Types: Cigarettes  . Smokeless tobacco: Never Used  Substance Use Topics  . Alcohol  use: No  . Drug use: No   Current Medications and Allergies:   .  albuterol (PROVENTIL HFA;VENTOLIN HFA) 108 (90 Base) MCG/ACT inhaler, Inhale 2 puffs into the lungs every 4    Allergies  Allergen Reactions  . Latex Rash  . Oxycodone-Acetaminophen Rash    Per RN- pt can tolerate vicodin  . Sulfa Antibiotics Rash and Photosensitivity  . Tape Rash   Review of Systems:   Pertinent items are noted in the HPI. Otherwise, a complete ROS is negative.  Vitals:  There were no vitals filed for this visit.   There is no height or weight on file to calculate BMI.  Physical Exam:   Physical Exam Vitals signs and nursing note reviewed.  HENT:     Head: Normocephalic and atraumatic.  Eyes:     Pupils: Pupils are equal, round, and reactive to light.  Neck:     Musculoskeletal: Normal range of motion and neck supple.  Cardiovascular:     Rate and Rhythm: Normal rate and regular rhythm.     Heart sounds: Normal heart sounds.  Pulmonary:     Effort: Pulmonary effort is normal.  Abdominal:     Palpations: Abdomen is soft.  Musculoskeletal:     Lumbar back: She exhibits decreased range of motion and spasm.       Legs:  Skin:    General: Skin  is warm.  Psychiatric:        Behavior: Behavior normal.     Assessment and Plan:   Zilda was seen today for establish care and leg pain.  Diagnoses and all orders for this visit:  Sciatica of left side -     cyclobenzaprine (FLEXERIL) 5 MG tablet; Take 1 tablet (5 mg total) by mouth 3 (three) times daily as needed for muscle spasms. -     HYDROcodone-acetaminophen (NORCO/VICODIN) 5-325 MG tablet; Take 1 tablet by mouth every 6 (six) hours as needed for moderate pain. -     methylPREDNISolone (MEDROL DOSEPAK) 4 MG TBPK tablet; Per package. -     methylPREDNISolone acetate (DEPO-MEDROL) injection 40 mg -     ketorolac (TORADOL) injection 60 mg  Weight gain -     CBC with Differential/Platelet -     Comprehensive metabolic panel -      Hemoglobin A1c -     TSH -     T4, free  Fatigue, unspecified type -     CBC with Differential/Platelet -     Comprehensive metabolic panel -     TSH -     T4, free  Tobacco dependence due to cigarettes  Screening for lipid disorders -     Lipid panel  Other orders -     LDL cholesterol, direct   . Orders and follow up as documented in EpicCare, reviewed diet, exercise and weight control, cardiovascular risk and specific lipid/LDL goals reviewed, reviewed medications and side effects in detail.  . Reviewed expectations re: course of current medical issues. . Outlined signs and symptoms indicating need for more acute intervention. . Patient verbalized understanding and all questions were answered. . Patient received an After Visit Summary.  Helane Rima, DO [place], [facility] 04/15/2018

## 2018-04-14 ENCOUNTER — Encounter: Payer: Self-pay | Admitting: Family Medicine

## 2018-04-14 DIAGNOSIS — F17210 Nicotine dependence, cigarettes, uncomplicated: Secondary | ICD-10-CM | POA: Insufficient documentation

## 2018-04-14 LAB — LIPID PANEL
Cholesterol: 180 mg/dL (ref 0–200)
HDL: 38.8 mg/dL — ABNORMAL LOW (ref >39.00)
NonHDL: 141.16
Total CHOL/HDL Ratio: 5
Triglycerides: 213 mg/dL — ABNORMAL HIGH (ref 0.0–149.0)
VLDL: 42.6 mg/dL — ABNORMAL HIGH (ref 0.0–40.0)

## 2018-04-14 LAB — CBC WITH DIFFERENTIAL/PLATELET
Basophils Absolute: 0.1 10*3/uL (ref 0.0–0.1)
Basophils Relative: 1.3 % (ref 0.0–3.0)
Eosinophils Absolute: 0.3 10*3/uL (ref 0.0–0.7)
Eosinophils Relative: 3.3 % (ref 0.0–5.0)
HCT: 41.3 % (ref 36.0–46.0)
Hemoglobin: 14.1 g/dL (ref 12.0–15.0)
Lymphocytes Relative: 38.8 % (ref 12.0–46.0)
Lymphs Abs: 3.5 10*3/uL (ref 0.7–4.0)
MCHC: 34.1 g/dL (ref 30.0–36.0)
MCV: 92.5 fl (ref 78.0–100.0)
Monocytes Absolute: 0.7 10*3/uL (ref 0.1–1.0)
Monocytes Relative: 7.5 % (ref 3.0–12.0)
Neutro Abs: 4.4 10*3/uL (ref 1.4–7.7)
Neutrophils Relative %: 49.1 % (ref 43.0–77.0)
Platelets: 269 10*3/uL (ref 150.0–400.0)
RBC: 4.47 Mil/uL (ref 3.87–5.11)
RDW: 12.6 % (ref 11.5–15.5)
WBC: 9 10*3/uL (ref 4.0–10.5)

## 2018-04-14 LAB — COMPREHENSIVE METABOLIC PANEL
ALT: 12 U/L (ref 0–35)
AST: 19 U/L (ref 0–37)
Albumin: 4.6 g/dL (ref 3.5–5.2)
Alkaline Phosphatase: 61 U/L (ref 39–117)
BUN: 13 mg/dL (ref 6–23)
CO2: 25 mEq/L (ref 19–32)
Calcium: 9.6 mg/dL (ref 8.4–10.5)
Chloride: 104 mEq/L (ref 96–112)
Creatinine, Ser: 0.79 mg/dL (ref 0.40–1.20)
GFR: 89.56 mL/min (ref >60.00)
Glucose, Bld: 99 mg/dL (ref 70–99)
Potassium: 4.1 mEq/L (ref 3.5–5.1)
Sodium: 138 mEq/L (ref 135–145)
Total Bilirubin: 0.6 mg/dL (ref 0.2–1.2)
Total Protein: 7.4 g/dL (ref 6.0–8.3)

## 2018-04-14 LAB — TSH: TSH: 2.73 u[IU]/mL (ref 0.35–4.50)

## 2018-04-14 LAB — LDL CHOLESTEROL, DIRECT: Direct LDL: 125 mg/dL

## 2018-04-14 LAB — HEMOGLOBIN A1C: Hgb A1c MFr Bld: 5.5 % (ref 4.6–6.5)

## 2018-04-14 LAB — T4, FREE: Free T4: 0.76 ng/dL (ref 0.60–1.60)

## 2018-06-27 ENCOUNTER — Other Ambulatory Visit: Payer: Self-pay

## 2018-06-27 MED ORDER — AMOXICILLIN 875 MG PO TABS: 875.0000 mg | ORAL_TABLET | Freq: Two times a day (BID) | ORAL | 0 refills | Status: DC

## 2018-10-21 ENCOUNTER — Ambulatory Visit: Payer: Self-pay | Admitting: *Deleted

## 2018-10-21 NOTE — Telephone Encounter (Signed)
Yes, with exposure. Please arrange for testing at [facility].

## 2018-10-21 NOTE — Telephone Encounter (Signed)
Patient states that she is asymptomatic, and would like to wait and be tested if she develops symptoms.  She plans to stay quarantined for 2 weeks with her son.  She will call back PCPs office if symptoms develop.

## 2018-10-21 NOTE — Telephone Encounter (Signed)
Message from Luciana Axe sent at 10/21/2018 8:38 AM EDT  Patient is calling because her son was advised to quarantinne. A positive case was identified at his school. The patient works at the school and is wondering should she be testing. No symptoms at this time. Please advise thank you.    Return call to patient regarding the above message. She stated that her son had been in contact with another child that he and his parents had tested positive for covid-19. 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 Her son's pediatrician is recommending that they both be quarantined for 14 days, which started on Thursday. She denies any symptoms with her son or herself. She was calling to make sure that she there is nothing else that she needs to do. She does not want to be tested if she is not having symptoms. Per protocol, she needs to continue to isolate for 14 days, pt voiced understanding. Routing to [facility] at [facility] for review and any other recommendation. Reason for Disposition . [1] No COVID-19 EXPOSURE BUT [2] living with someone who was exposed and who has no symptoms of COVID-19  Protocols used: CORONAVIRUS (COVID-19) EXPOSURE-A-AH

## 2018-10-21 NOTE — Telephone Encounter (Signed)
FYI

## 2018-10-24 ENCOUNTER — Telehealth: Payer: Self-pay | Admitting: *Deleted

## 2018-10-24 ENCOUNTER — Other Ambulatory Visit: Payer: Self-pay

## 2018-10-24 ENCOUNTER — Encounter: Payer: Self-pay | Admitting: Family Medicine

## 2018-10-24 DIAGNOSIS — Z20822 Contact with and (suspected) exposure to covid-19: Secondary | ICD-10-CM

## 2018-10-24 NOTE — Telephone Encounter (Signed)
Testing ordered

## 2018-10-24 NOTE — Telephone Encounter (Signed)
Pt scheduled for covid testing today @ 3:30 @ [facility]. Instructions given and order placed.

## 2018-10-24 NOTE — Telephone Encounter (Signed)
-----   Message from Darral Dash, [place] sent at 10/24/2018  9:54 AM EDT ----- Regarding: CoVid testing Dr. Juleen China would like to order CoVid testing NAME:Carel J Drier DOB:[birth year] JHH:[id number] Reason: Job request Insurance: [employer] I.D #: [id number]

## 2018-10-27 LAB — NOVEL CORONAVIRUS, NAA: SARS-CoV-2, NAA: NOT DETECTED

## 2020-09-20 ENCOUNTER — Encounter: Payer: Self-pay | Admitting: Registered Nurse

## 2020-09-20 ENCOUNTER — Other Ambulatory Visit: Payer: Self-pay

## 2020-09-20 ENCOUNTER — Ambulatory Visit (INDEPENDENT_AMBULATORY_CARE_PROVIDER_SITE_OTHER): Payer: 59 | Admitting: Registered Nurse

## 2020-09-20 VITALS — BP 167/98 | HR 71 | Temp 98.3°F | Resp 18 | Ht 63.5 in | Wt 188.8 lb

## 2020-09-20 DIAGNOSIS — M5441 Lumbago with sciatica, right side: Secondary | ICD-10-CM

## 2020-09-20 DIAGNOSIS — R55 Syncope and collapse: Secondary | ICD-10-CM | POA: Diagnosis not present

## 2020-09-20 DIAGNOSIS — S0990XA Unspecified injury of head, initial encounter: Secondary | ICD-10-CM | POA: Diagnosis not present

## 2020-09-20 DIAGNOSIS — M542 Cervicalgia: Secondary | ICD-10-CM

## 2020-09-20 DIAGNOSIS — F419 Anxiety disorder, unspecified: Secondary | ICD-10-CM | POA: Diagnosis not present

## 2020-09-20 DIAGNOSIS — G8929 Other chronic pain: Secondary | ICD-10-CM

## 2020-09-20 DIAGNOSIS — M546 Pain in thoracic spine: Secondary | ICD-10-CM

## 2020-09-20 DIAGNOSIS — R4020 Unspecified coma: Secondary | ICD-10-CM

## 2020-09-20 DIAGNOSIS — R220 Localized swelling, mass and lump, head: Secondary | ICD-10-CM | POA: Diagnosis not present

## 2020-09-20 DIAGNOSIS — M5442 Lumbago with sciatica, left side: Secondary | ICD-10-CM

## 2020-09-20 MED ORDER — HYDROXYZINE HCL 10 MG PO TABS: 5.0000 mg | ORAL_TABLET | Freq: Three times a day (TID) | ORAL | 0 refills | Status: DC | PRN

## 2020-09-20 MED ORDER — ESCITALOPRAM OXALATE 10 MG PO TABS: 10.0000 mg | ORAL_TABLET | Freq: Every day | ORAL | 0 refills | Status: DC

## 2020-09-20 NOTE — Progress Notes (Signed)
New Patient Office Visit  Subjective:  Patient ID: [name], female    DOB: 09/22/1985  Age: 35 y.o. MRN: [id number]  CC:  Chief Complaint  Patient presents with  . Fall    Patient states yesterday she started to get hot and a tingling feeling and told family that she felt like she needed air. Patient states she remembers waking up to family standing over her and EMS. Patient has had this problem before and think she has a new allergy to dairy because when she intake dairy she feels weird and hive up also it might be anxiety as well . She states that she hit her head and is having some pain today as well.    HPI [name] presents for syncope and collapse  Hx of mono, lymphadenopathy, substance misuse Hx of anxiety: zoloft in past, had subtherapeutic response and stopped taking this about five years ago. Notes ongoing anxiety and feelings of hopelessness, denies HI/SI  Notes she was at her parents house when she felt pins and needles in her arm. Thinks this was because she was holding an infant. She was able to pass off the infant, get temporary relief, but minutes later felt pins and needles in arms again. Felt warm and chest tight, felt as though she needed air. Went to walk outside and collapsed in hall, partially caught by her partner. Small strike to the back of her head but no broken skin, assessed by EMS to not need further follow up. EMS also ran EKG which she has with her. Appears wnl. No comparison available.  Admits she does not like to be seen in healthcare offices and is hesitant to pursue work up  Has had a previous episode like this "13-14 years ago", but suspects this was related to outside factors, not same as this occasion  BP elevated today, pt states she is nervous   Past Medical History:  Diagnosis Date  . Anxiety   . Arthritis   . Asthma   . Depression   . Drug abuse (HCC)   . Headache(784.0)   . Hypothyroidism   . Migraine   . Sciatic leg  pain     Past Surgical History:  Procedure Laterality Date  . CESAREAN SECTION    . CESAREAN SECTION N/A 11/16/2012   Procedure: CESAREAN SECTION;  Surgeon: Bing Plume, MD;  Location: [facility];  Service: Obstetrics;  Laterality: N/A;  REPEAT  . WISDOM TOOTH EXTRACTION    . WRIST SURGERY      Family History  Problem Relation Age of Onset  . Arthritis Mother   . Arthritis Father   . Asthma Son     Social History   Socioeconomic History  . Marital status: Divorced    Spouse name: Not on file  . Number of children: Not on file  . Years of education: Not on file  . Highest education level: Not on file  Occupational History    Employer: [employer]  Tobacco Use  . Smoking status: Current Some Day Smoker    Types: Cigarettes  . Smokeless tobacco: Never Used  Substance and Sexual Activity  . Alcohol use: No  . Drug use: No  . Sexual activity: Not on file  Other Topics Concern  . Not on file  Social History Narrative  . Not on file   Social Determinants of Health   Financial Resource Strain: Not on file  Food Insecurity: Not on file  Transportation Needs:  Not on file  Physical Activity: Not on file  Stress: Not on file  Social Connections: Not on file  Intimate Partner Violence: Not on file    ROS Review of Systems  Constitutional: Negative.   HENT: Negative.   Eyes: Negative.   Respiratory: Negative.   Cardiovascular: Negative.   Gastrointestinal: Negative.   Genitourinary: Negative.   Musculoskeletal: Negative.   Skin: Negative.   Neurological: Negative.   Psychiatric/Behavioral: Negative.   All other systems reviewed and are negative.   Objective:   Today's Vitals: BP (!) 167/98   Pulse 71   Temp 98.3 F (36.8 C) (Temporal)   Resp 18   Ht 5' 3.5" (1.613 m)   Wt 188 lb 12.8 oz (85.6 kg)   SpO2 99%   BMI 32.92 kg/m   Physical Exam Vitals and nursing note reviewed.  Constitutional:      General: She is not in acute distress.     Appearance: Normal appearance. She is not ill-appearing, toxic-appearing or diaphoretic.  Cardiovascular:     Rate and Rhythm: Normal rate and regular rhythm.     Pulses: Normal pulses.     Heart sounds: Normal heart sounds. No murmur heard. No friction rub. No gallop.   Pulmonary:     Effort: Pulmonary effort is normal. No respiratory distress.     Breath sounds: Normal breath sounds. No stridor. No wheezing, rhonchi or rales.  Chest:     Chest wall: No tenderness.  Skin:    General: Skin is warm and dry.     Capillary Refill: Capillary refill takes less than 2 seconds.  Neurological:     General: No focal deficit present.     Mental Status: She is alert and oriented to person, place, and time. Mental status is at baseline.     Cranial Nerves: No cranial nerve deficit.     Motor: No weakness.     Gait: Gait normal.  Psychiatric:        Mood and Affect: Mood normal.        Behavior: Behavior normal.        Thought Content: Thought content normal.        Judgment: Judgment normal.     Assessment & Plan:   Problem List Items Addressed This Visit   None   Visit Diagnoses    Syncope and collapse    -  Primary   Relevant Orders   CBC with Differential/Platelet   Comprehensive metabolic panel   Hemoglobin A1c   TSH   CK   Vitamin D (25 hydroxy)   B12   CT Head Wo Contrast   Anxiety       Relevant Medications   escitalopram (LEXAPRO) 10 MG tablet   hydrOXYzine (ATARAX/VISTARIL) 10 MG tablet   Injury of head, initial encounter       Relevant Orders   CT Head Wo Contrast   Head lump       Relevant Orders   CT Head Wo Contrast   Loss of consciousness (HCC)       Relevant Orders   CT Head Wo Contrast   Chronic midline low back pain with bilateral sciatica       Relevant Medications   escitalopram (LEXAPRO) 10 MG tablet   hydrOXYzine (ATARAX/VISTARIL) 10 MG tablet   Other Relevant Orders   DG Lumbar Spine Complete   Chronic neck pain       Relevant Medications    escitalopram (LEXAPRO) 10 MG tablet  Other Relevant Orders   DG Cervical Spine Complete   Chronic bilateral thoracic back pain       Relevant Medications   escitalopram (LEXAPRO) 10 MG tablet   Other Relevant Orders   DG Thoracic Spine W/Swimmers      Outpatient Encounter Medications as of 09/20/2020  Medication Sig  . escitalopram (LEXAPRO) 10 MG tablet Take 1 tablet (10 mg total) by mouth daily. Can increase to 2 tablets (20mg  total) by mouth daily after 2 weeks if tolerated well.  . hydrOXYzine (ATARAX/VISTARIL) 10 MG tablet Take 0.5-1 tablets (5-10 mg total) by mouth 3 (three) times daily as needed.  . [DISCONTINUED] amoxicillin (AMOXIL) 875 MG tablet Take 1 tablet (875 mg total) by mouth 2 (two) times daily. (Patient not taking: Reported on 09/20/2020)  . [DISCONTINUED] cyclobenzaprine (FLEXERIL) 5 MG tablet Take 1 tablet (5 mg total) by mouth 3 (three) times daily as needed for muscle spasms. (Patient not taking: Reported on 09/20/2020)  . [DISCONTINUED] HYDROcodone-acetaminophen (NORCO/VICODIN) 5-325 MG tablet Take 1 tablet by mouth every 6 (six) hours as needed for moderate pain. (Patient not taking: Reported on 09/20/2020)  . [DISCONTINUED] methylPREDNISolone (MEDROL DOSEPAK) 4 MG TBPK tablet Per package. (Patient not taking: Reported on 09/20/2020)   No facility-administered encounter medications on file as of 09/20/2020.    Follow-up: Return if symptoms worsen or fail to improve.   PLAN  Exam reassuring.   Unclear etiology for syncope. Labs and imaging ordered as above. A few differentials would include: panic attack, seizure, transient arrhythmia, among many others.   BP elevated in office today. Pt to return for bp recheck at follow up  Refer to neuro  Plan follow up based on labs  Start lexapro 10mg  po qhs and hydroxyzine 5-10mg  PO tid prn  Patient encouraged to call clinic with any questions, comments, or concerns.  09/22/2020, NP

## 2020-09-20 NOTE — Patient Instructions (Addendum)
Ms Logie -   Randie Heinz to meet you  Let's see what the labs have to say. We're looking at: blood counts, liver and kidney function, sugars, thyroid, CK for muscle breakdown/heart injury/brain injury, bnp for heart damage, vitamin d and b12. I'll be in touch via MyChart or by phone if I have acute concerns.   I will have the imaging center reach out about the CT scan. For the Xrays, please head to [address] to [facility] [employer] [facility] [employer]. They take walk ins for xrays - though probably not until early next week at this point!  We can plan follow up based on what we find on labs and imaging. I'll keep you in the loop on my thoughts.  I think a referral to neurology is prudent given the nature of your episode, so I will place that today. They likely won't be in touch for a couple of weeks.  Thank you  Rich     If you have lab work done today you will be contacted with your lab results within the next 2 weeks.  If you have not heard from [place] then please contact us. The fastest way to get your results is to register for My Chart.   IF you received an x-ray today, you will receive an invoice from [facility]. Please contact [facility] at [phone] with questions or concerns regarding your invoice.   IF you received labwork today, you will receive an invoice from [facility]. Please contact [facility] at [phone] with questions or concerns regarding your invoice.   Our billing staff will not be able to assist you with questions regarding bills from these companies.  You will be contacted with the lab results as soon as they are available. The fastest way to get your results is to activate your My Chart account. Instructions are located on the last page of this paperwork. If you have not heard from [place] regarding the results in 2 weeks, please contact this office.

## 2020-09-21 LAB — TSH: TSH: 4.88 mIU/L — ABNORMAL HIGH

## 2020-09-21 LAB — CBC WITH DIFFERENTIAL/PLATELET
Absolute Monocytes: 517 cells/uL (ref 200–950)
Basophils Absolute: 41 cells/uL (ref 0–200)
Basophils Relative: 0.5 %
Eosinophils Absolute: 107 cells/uL (ref 15–500)
Eosinophils Relative: 1.3 %
HCT: 41 % (ref 35.0–45.0)
Hemoglobin: 13.8 g/dL (ref 11.7–15.5)
Lymphs Abs: 3190 cells/uL (ref 850–3900)
MCH: 32.5 pg (ref 27.0–33.0)
MCHC: 33.7 g/dL (ref 32.0–36.0)
MCV: 96.5 fL (ref 80.0–100.0)
MPV: 9.9 fL (ref 7.5–12.5)
Monocytes Relative: 6.3 %
Neutro Abs: 4346 cells/uL (ref 1500–7800)
Neutrophils Relative %: 53 %
Platelets: 371 10*3/uL (ref 140–400)
RBC: 4.25 10*6/uL (ref 3.80–5.10)
RDW: 12.3 % (ref 11.0–15.0)
Total Lymphocyte: 38.9 %
WBC: 8.2 10*3/uL (ref 3.8–10.8)

## 2020-09-21 LAB — COMPREHENSIVE METABOLIC PANEL
AG Ratio: 1.6 (calc) (ref 1.0–2.5)
ALT: 7 U/L (ref 6–29)
AST: 11 U/L (ref 10–30)
Albumin: 4.4 g/dL (ref 3.6–5.1)
Alkaline phosphatase (APISO): 48 U/L (ref 31–125)
BUN: 14 mg/dL (ref 7–25)
CO2: 22 mmol/L (ref 20–32)
Calcium: 9.8 mg/dL (ref 8.6–10.2)
Chloride: 106 mmol/L (ref 98–110)
Creat: 0.92 mg/dL (ref 0.50–1.10)
Globulin: 2.7 g/dL (calc) (ref 1.9–3.7)
Glucose, Bld: 90 mg/dL (ref 65–99)
Potassium: 4.1 mmol/L (ref 3.5–5.3)
Sodium: 138 mmol/L (ref 135–146)
Total Bilirubin: 0.5 mg/dL (ref 0.2–1.2)
Total Protein: 7.1 g/dL (ref 6.1–8.1)

## 2020-09-21 LAB — VITAMIN B12: Vitamin B-12: 176 pg/mL — ABNORMAL LOW (ref 200–1100)

## 2020-09-21 LAB — HEMOGLOBIN A1C
Hgb A1c MFr Bld: 4.9 % of total Hgb (ref <5.7)
Mean Plasma Glucose: 94 mg/dL
eAG (mmol/L): 5.2 mmol/L

## 2020-09-21 LAB — VITAMIN D 25 HYDROXY (VIT D DEFICIENCY, FRACTURES): Vit D, 25-Hydroxy: 23 ng/mL — ABNORMAL LOW (ref 30–100)

## 2020-09-21 LAB — CK: Total CK: 66 U/L (ref 29–143)

## 2020-09-24 ENCOUNTER — Other Ambulatory Visit: Payer: Self-pay | Admitting: Registered Nurse

## 2020-09-24 ENCOUNTER — Encounter: Payer: Self-pay | Admitting: Registered Nurse

## 2020-09-24 DIAGNOSIS — E039 Hypothyroidism, unspecified: Secondary | ICD-10-CM

## 2020-09-24 MED ORDER — LEVOTHYROXINE SODIUM 50 MCG PO TABS: 50.0000 ug | ORAL_TABLET | Freq: Every day | ORAL | 0 refills | Status: DC

## 2020-09-26 ENCOUNTER — Ambulatory Visit
Admission: RE | Admit: 2020-09-26 | Discharge: 2020-09-26 | Disposition: A | Discharge status: Home / Self Care | Payer: 59 | Source: Ambulatory Visit | Attending: Registered Nurse | Admitting: Registered Nurse

## 2020-09-26 ENCOUNTER — Other Ambulatory Visit: Payer: Self-pay

## 2020-09-26 DIAGNOSIS — G8929 Other chronic pain: Secondary | ICD-10-CM

## 2020-09-26 DIAGNOSIS — M542 Cervicalgia: Secondary | ICD-10-CM

## 2020-09-26 DIAGNOSIS — M546 Pain in thoracic spine: Secondary | ICD-10-CM

## 2020-09-27 ENCOUNTER — Ambulatory Visit (INDEPENDENT_AMBULATORY_CARE_PROVIDER_SITE_OTHER)
Admission: RE | Admit: 2020-09-27 | Discharge: 2020-09-27 | Disposition: A | Discharge status: Home / Self Care | Payer: 59 | Source: Ambulatory Visit | Attending: Registered Nurse | Admitting: Registered Nurse

## 2020-09-27 DIAGNOSIS — R55 Syncope and collapse: Secondary | ICD-10-CM | POA: Diagnosis not present

## 2020-09-27 DIAGNOSIS — R4020 Unspecified coma: Secondary | ICD-10-CM

## 2020-09-27 DIAGNOSIS — S0990XA Unspecified injury of head, initial encounter: Secondary | ICD-10-CM

## 2020-09-27 DIAGNOSIS — R220 Localized swelling, mass and lump, head: Secondary | ICD-10-CM

## 2020-09-29 ENCOUNTER — Encounter: Payer: Self-pay | Admitting: Registered Nurse

## 2020-10-02 ENCOUNTER — Telehealth: Payer: Self-pay | Admitting: Neurology

## 2020-10-02 ENCOUNTER — Encounter: Payer: Self-pay | Admitting: Neurology

## 2020-10-02 ENCOUNTER — Ambulatory Visit: Payer: 59 | Admitting: Neurology

## 2020-10-02 VITALS — BP 130/74 | HR 83 | Ht 65.0 in | Wt 191.0 lb

## 2020-10-02 DIAGNOSIS — R55 Syncope and collapse: Secondary | ICD-10-CM

## 2020-10-02 NOTE — Progress Notes (Signed)
Provider:  Melvyn Novas, MD  Primary Care Physician:  Janeece Agee, NP [address] [place] [address]     Referring Provider: Janeece Agee, Np [address] [place],  [place] [address]          Chief Complaint according to patient   Patient presents with:    . New Patient (Initial Visit)     Here for evaluation post head injury and possible seizure; patient reports that her hands went numb, her ears started ringing, she got up to go outside then woke up on her parents floor with the ambulance on the way. Patient states she has had episodes where her ears ring and she feels faint. She will go and lie on her bed and feels better when she wakes up. She hasn't fallen before that she knows of. She had an episode in 2008. It is happening more frequently now. It can happen with stress, anxiety (!).      HISTORY OF PRESENT ILLNESS:  [name] is a 35 y.o. Caucasian female patient and was referred by NP Kateri Plummer to be seen here on 10/02/2020 for an evaluation of spells.  Chief concern  :  10-02-2020: The patient reports a spell that occurred on 19 Sep 2020- she felt that her ears were under pressure and there was a ringing and a pulsation in her ears, she started to feel slightly faint and lightheaded.  there was tingling in her extremities  and she felt as if she had to catch her breath or at least cool down outside.   The patient remembers that she was with family during this spell and she remembers waking up and her family standing around her visit EMS.  She has had this problem once before 15 years ago severely and was diagnosed with syncope spells.  She does have a history of anxiety but she has never had a convulsive spells or spells of staring attacks.  The spell actually was preceded at her parents house when she felt pins-and-needles in her arm and seemed she was holding an infant at the time she had time to pass the infant to another adult calm down and get  temporary relief from symptoms but minutes later pins-and-needles in the arms reoccurred.  She feels warm but not necessarily hot and her chest gets tight there was a person with her when she collapsed outside. Family called 911 concerned about her pulse not being palpable.  She appeared very pale to her family.  She woke up noticing that she had urinary incontinence.  It seems that they may have been tonic-clonic activity and that she was unresponsive to her family members verbal and haptic stimuli.  The patient's mother confirms that her eyes were closed.  She has been prescribed Zoloft by PCP and has not filled the script, is unwilling to take it.    I have the pleasure of seeing [name] on 10-02-2020  She has a past medical history of Anxiety, Asthma, Back pain, Substance Abuse, Drug abuse (HCC), Hypothyroidism, Migraine, and Sciatica chronica.    Social history:  Patient is working as Tax adviser -  and lives in a household with 2 children and a boyfriend alone.  Family status is divorced.  The patient currently works day shift. Pets are  Present. 3 dogs and a cat, fish and gecko.  Tobacco use: cigarettes, vaping.daily-.  ETOH use - drinks every  other day,  Caffeine intake in form of Coffee( 1-2 cups in AM ) , Tea ( 1 a day) or energy drinks. Regular exercise in form of walking.    Sleep habits are as follows: The patient's dinner time is between 6-7 PM. The patient goes to bed at 9 PM and continues to sleep for  A total of 7-8 hours, wakes for one bathroom breaks, the first time at 2-3 AM.   The preferred sleep position is laterally, with the support of 1-2 pillows. Dreams are reportedly frequent. Marland Kitchen 5.45  AM is the usual rise time. The patient wakes up with an alarm. She reports not feeling refreshed or restored in AM, with symptoms such as dry mouth , katamenial migraine- morning headaches, and residual fatigue.     Review of Systems: Out of a complete 14 system  review, the patient complains of only the following symptoms, and all other reviewed systems are negative.:   spells, anxiety,      Social History   Socioeconomic History  . Marital status: Divorced    Spouse name: Not on file  . Number of children: Not on file  . Years of education: Not on file  . Highest education level: Not on file  Occupational History    Employer: warehouse  Tobacco Use  . Smoking status: Current Some Day Smoker    Types: Cigarettes  . Smokeless tobacco: Never Used  Vaping Use  . Vaping Use: Former  Substance and Sexual Activity  . Alcohol use: Yes    Comment: "on occasion"  . Drug use: Yes    Types: Marijuana    Comment: "occasionally"  . Sexual activity: Not on file  Other Topics Concern  . Not on file  Social History Narrative   Resides with daughter, son, boyfriend and his daughter   Right handed   Caffeine: 2-3 cups/day   Social Determinants of Health   Financial Resource Strain: Not on file  Food Insecurity: Not on file  Transportation Needs: Not on file  Physical Activity: Not on file  Stress: Not on file  Social Connections: Not on file    Family History  Problem Relation Age of Onset  . Arthritis Mother   . Arthritis Father   . Asthma Son   . Seizures Neg Hx     Past Medical History:  Diagnosis Date  . Anxiety   . Arthritis   . Asthma   . Back pain    low   . Back problem    disc problem cervical, thoracic, lumbar   . Depression   . Drug abuse (HCC)   . Headache(784.0)   . Hypothyroidism   . Migraine   . Sciatic leg pain     Past Surgical History:  Procedure Laterality Date  . CESAREAN SECTION    . CESAREAN SECTION N/A 11/16/2012   Procedure: CESAREAN SECTION;  Surgeon: Bing Plume, MD;  Location: [facility];  Service: Obstetrics;  Laterality: N/A;  REPEAT  . WISDOM TOOTH EXTRACTION    . WRIST SURGERY       Current Outpatient Medications on File Prior to Visit  Medication Sig Dispense Refill  . hydrOXYzine  (ATARAX/VISTARIL) 10 MG tablet Take 0.5-1 tablets (5-10 mg total) by mouth 3 (three) times daily as needed. 30 tablet 0  . levothyroxine (SYNTHROID) 50 MCG tablet Take 1 tablet (50 mcg total) by mouth daily. 90 tablet 0  . escitalopram (LEXAPRO) 10 MG tablet Take 1 tablet (10 mg total)  by mouth daily. Can increase to 2 tablets (20mg  total) by mouth daily after 2 weeks if tolerated well. (Patient not taking: Reported on 10/02/2020) 90 tablet 0   No current facility-administered medications on file prior to visit.    Allergies  Allergen Reactions  . Latex Rash  . Oxycodone-Acetaminophen Rash    Per RN- pt can tolerate vicodin  . Sulfa Antibiotics Rash and Photosensitivity  . Tape Rash    Physical exam:  Today's Vitals   10/02/20 1315  BP: 130/74  Pulse: 83  Weight: 191 lb (86.6 kg)  Height: 5\' 5"  (1.651 m)   Body mass index is 31.78 kg/m.   Wt Readings from Last 3 Encounters:  10/02/20 191 lb (86.6 kg)  09/20/20 188 lb 12.8 oz (85.6 kg)  04/15/18 205 lb (93 kg)     Ht Readings from Last 3 Encounters:  10/02/20 5\' 5"  (1.651 m)  09/20/20 5' 3.5" (1.613 m)  04/15/18 5' 3.5" (1.613 m)      General: The patient is awake, alert and appears not in acute distress. The patient is well groomed. Head: Normocephalic, atraumatic. Neck is supple.  No tongue bite.  Cardiovascular:  Regular rate and cardiac rhythm by pulse,  without distended neck veins. Respiratory: Lungs are clear to auscultation.  Skin:  Without evidence of ankle edema, or rash. Trunk: The patient's posture is erect.   Neurologic exam : The patient is awake and alert, oriented to place and time.   Memory subjective described as intact.  Attention span & concentration ability appears normal.  Speech is fluent,  without  dysarthria, dysphonia or aphasia.  Mood and affect are appropriate.   Cranial nerves: no loss of smell or taste reported  Pupils are equal and briskly reactive to light. Funduscopic exam .   Extraocular movements in vertical and horizontal planes were intact and without nystagmus. No Diplopia. Visual fields by finger perimetry are intact. Hearing was intact to soft voice and finger rubbing.    Facial sensation intact to fine touch.  Facial motor strength is symmetric and tongue and uvula move midline.  Neck ROM : rotation, tilt and flexion extension were normal for age and shoulder shrug was symmetrical.    Motor exam:  Symmetric bulk, tone and ROM.   Normal tone without cog wheeling, symmetric grip strength .   Sensory:  Fine touch, vibration  were normal.  Proprioception tested in the upper extremities was normal.   Coordination: Rapid alternating movements in the fingers/hands were of normal speed.  The Finger-to-nose maneuver was intact without evidence of ataxia, dysmetria or tremor.   Gait and station: Patient could rise unassisted from a seated position, walked without assistive device.  Stance is of normal width/ base and the patient turned with 3 steps.  Toe and heel walk were deferred.  Deep tendon reflexes: in the  upper and lower extremities are symmetric and intact.  Babinski response was deferred .      After spending a total time of 46 minutes face to face and additional time for physical and neurologic examination, review of laboratory studies,  personal review of imaging studies, reports and results of other testing and review of referral information / records as far as provided in visit, I have established the following assessments:  1) could have been a vasovagal syncope given the warm feeling, the change in hearing and pallor.  Eyes were closed, mother states there was no convulsion, boyfriend reports some twitching.   2) could  be a convulsive syncope or seizure- given the incontinence.   3) no toxicology screen was ordered...    My Plan is to proceed with:  1) EEG 2) MRI brain optional - , she had a CT without contrast due to having hit her  head.   3) RV in 1-2 month with NP or me.   I would like to thank Janeece AgeeMorrow, Richard, NP and Janeece AgeeMorrow, Richard, Np [address] [place],  [place] [id number] for allowing me to meet with and to take care of this pleasant patient.     RESULT  follow up  through our NP within 2 month.   CC: I will share my notes with PCP.   Electronically signed by: Melvyn Novasarmen Jahari Wiginton, MD 10/02/2020 1:48 PM  [facility] and [facility] Board certified by The [employer] of Sleep Medicine and Diplomate of the [employer] of [employer] Medicine. Board certified In Neurology through the ABPN, Fellow of the [employer] of Neu[employer]. Medical Director of [facility].

## 2020-10-02 NOTE — Telephone Encounter (Signed)
10/02/20 [facility] CZYS:[id number] (10/02/20-11/16/20) [facility] LVM for patient to call back to schedule

## 2020-10-02 NOTE — Patient Instructions (Signed)
Near-Syncope Near-syncope is when you suddenly get weak or dizzy, or you feel like you might pass out (faint). This may also be called presyncope. This is due to a lack of blood flow to the brain. During an episode of near-syncope, you may:  Feel dizzy, weak, or light-headed.  Feel sick to your stomach (nauseous).  See all white or all black.  See spots.  Have cold, clammy skin. This condition is caused by a sudden decrease in blood flow to the brain. This decrease can result from various causes, but most of those causes are not dangerous. However, near-syncope may be a sign of a serious medical problem, so it is important to seek medical care. Follow these instructions at home: Medicines  Take over-the-counter and prescription medicines only as told by your doctor.  If you are taking blood pressure or heart medicine, get up slowly and spend many minutes getting ready to sit and then stand. This can help with dizziness. General instructions  Be aware of any changes in your symptoms.  Talk with your doctor about your symptoms. You may need to have testing to find the cause of your near-syncope.  If you start to feel like you might pass out, lie down right away. Raise (elevate) your feet above the level of your heart. Breathe deeply and steadily. Wait until all of the symptoms are gone.  Have someone stay with you until you feel stable.  Do not drive, use machinery, or play sports until your doctor says it is okay.  Drink enough fluid to keep your pee (urine) pale yellow.  Keep all follow-up visits as told by your doctor. This is important. Get help right away if you:  Have a seizure.  Have pain in your: ? Chest. ? Belly (abdomen). ? Back.  Faint once or more than once.  Have a very bad headache.  Are bleeding from your mouth or butt.  Have black or tarry poop (stool).  Have a very fast or uneven heartbeat (palpitations).  Are mixed up (confused).  Have trouble  walking.  Are very weak.  Have trouble seeing. These symptoms may be an emergency. Do not wait to see if the symptoms will go away. Get medical help right away. Call your local emergency services (911 in the [place] Do not drive yourself to the hospital. Summary  Near-syncope is when you suddenly get weak or dizzy, or you feel like you might pass out (faint).  This condition is caused by a lack of blood flow to the brain.  Near-syncope may be a sign of a serious medical problem, so it is important to seek medical care. This information is not intended to replace advice given to you by your health care provider. Make sure you discuss any questions you have with your health care provider. Document Revised: 08/05/2018 Document Reviewed: 03/02/2018 Elsevier Patient Education  2021 Elsevier Inc. Syncope  Syncope refers to a condition in which a person temporarily loses consciousness. Syncope may also be called fainting or passing out. It is caused by a sudden decrease in blood flow to the brain. Even though most causes of syncope are not dangerous, syncope can be a sign of a serious medical problem. Your health care provider may do tests to find the reason why you are having syncope. Signs that you may be about to faint include:  Feeling dizzy or light-headed.  Feeling nauseous.  Seeing all white or all black in your field of vision.  Having cold, clammy  skin. If you faint, get medical help right away. Call your local emergency services (911 in the [place] Do not drive yourself to the hospital. Follow these instructions at home: Pay attention to any changes in your symptoms. Take these actions to stay safe and to help relieve your symptoms: Lifestyle  Do not drive, use machinery, or play sports until your health care provider says it is okay.  Do not drink alcohol.  Do not use any products that contain nicotine or tobacco, such as cigarettes and e-cigarettes. If you need help quitting,  ask your health care provider.  Drink enough fluid to keep your urine pale yellow. General instructions  Take over-the-counter and prescription medicines only as told by your health care provider.  If you are taking blood pressure or heart medicine, get up slowly and take several minutes to sit and then stand. This can reduce dizziness or light-headedness.  Have someone stay with you until you feel stable.  If you start to feel like you might faint, lie down right away and raise (elevate) your feet above the level of your heart. Breathe deeply and steadily. Wait until all the symptoms have passed.  Keep all follow-up visits as told by your health care provider. This is important. Get help right away if you:  Have a severe headache.  Faint once or repeatedly.  Have pain in your chest, abdomen, or back.  Have a very fast or irregular heartbeat (palpitations).  Have pain when you breathe.  Are bleeding from your mouth or rectum, or you have black or tarry stool.  Have a seizure.  Are confused.  Have trouble walking.  Have severe weakness.  Have vision problems. These symptoms may represent a serious problem that is an emergency. Do not wait to see if your symptoms will go away. Get medical help right away. Call your local emergency services (911 in the [place] Do not drive yourself to the hospital. Summary  Syncope refers to a condition in which a person temporarily loses consciousness. It is caused by a sudden decrease in blood flow to the brain.  Signs that you may be about to faint include dizziness, feeling light-headed, feeling nauseous, sudden vision changes, or cold, clammy skin.  Although most causes of syncope are not dangerous, syncope can be a sign of a serious medical problem. If you faint, get medical help right away. This information is not intended to replace advice given to you by your health care provider. Make sure you discuss any questions you have with  your health care provider. Document Revised: 08/24/2019 Document Reviewed: 08/24/2019 Elsevier Patient Education  2021 [employer].

## 2020-10-04 ENCOUNTER — Other Ambulatory Visit: Payer: Self-pay | Admitting: Registered Nurse

## 2020-10-09 ENCOUNTER — Ambulatory Visit: Payer: 59 | Admitting: Neurology

## 2020-10-09 DIAGNOSIS — R55 Syncope and collapse: Secondary | ICD-10-CM

## 2020-10-10 ENCOUNTER — Telehealth: Payer: Self-pay | Admitting: Neurology

## 2020-10-10 NOTE — Telephone Encounter (Signed)
Spoke with [facility] rep and had site changed to [facility]. Auth # is [id number].

## 2020-10-10 NOTE — Telephone Encounter (Signed)
[facility] [employer]) called, issue with authorization; should be [facility] instead Environmental health practitioner. Would like a call from the nurse.

## 2020-10-13 ENCOUNTER — Other Ambulatory Visit: Payer: Self-pay | Admitting: Registered Nurse

## 2020-10-13 ENCOUNTER — Ambulatory Visit
Admission: RE | Admit: 2020-10-13 | Discharge: 2020-10-13 | Disposition: A | Discharge status: Home / Self Care | Payer: 59 | Source: Ambulatory Visit | Attending: Neurology | Admitting: Neurology

## 2020-10-13 DIAGNOSIS — R55 Syncope and collapse: Secondary | ICD-10-CM

## 2020-10-13 DIAGNOSIS — F419 Anxiety disorder, unspecified: Secondary | ICD-10-CM

## 2020-10-13 MED ORDER — GADOBENATE DIMEGLUMINE 529 MG/ML IV SOLN
17.0000 mL | Freq: Once | INTRAVENOUS | Status: AC | PRN
  Administered 2020-10-13: 17 mL via INTRAVENOUS

## 2020-10-13 NOTE — Procedures (Signed)
This patient presented on 6/11-2020 with a chief complaint of convulsive syncope.  The spell occurred on 19 Sep 2020.  This EEG study was performed with electrode placement following the 10-20 International system.  High and low frequency filters were set, a single EKG electrode accompanies these tracings.  No video recording.  EKG in NSR at 54-62 bpm. There is no excessive motion artifact noted.   The patient appears to be still and follows calibration maneuvers.   A posterior dominant rhythm was identified at 9 Hz and is of moderate amplitude. The  rhythm promptly attenuates with eye opening.    Hyperventilation is then initiated and shows a further amplitude buildup especially over the biparietal regions.  There is no abnormal or epileptiform discharge noted.   The activity is symmetric.   After 2 minutes, photic stimulation follows, showing strong entrainment at all frequencies.   Again this is a normal and symmetric response -no epileptiform discharges are noted.  Photic stimulation is then followed by a period of rest during which the patient became drowsy and finally entered non-REM sleep stage I and 2 , indicated by K complexes.   Vertex sharp waves were noted- these are physiological manifestations of sleep.    Conclusion :This is a normal EEG

## 2020-10-13 NOTE — Progress Notes (Signed)
Conclusion :This is a normal EEG. Duration of recording was 26 min. 24 sec.

## 2020-10-14 ENCOUNTER — Encounter: Payer: Self-pay | Admitting: *Deleted

## 2020-10-15 ENCOUNTER — Ambulatory Visit: Payer: 59 | Admitting: Registered Nurse

## 2020-10-15 ENCOUNTER — Telehealth: Payer: Self-pay | Admitting: Neurology

## 2020-10-15 ENCOUNTER — Other Ambulatory Visit: Payer: Self-pay

## 2020-10-15 ENCOUNTER — Encounter: Payer: Self-pay | Admitting: Registered Nurse

## 2020-10-15 VITALS — HR 77 | Temp 98.3°F | Resp 17 | Ht 65.0 in | Wt 191.0 lb

## 2020-10-15 DIAGNOSIS — M5441 Lumbago with sciatica, right side: Secondary | ICD-10-CM | POA: Diagnosis not present

## 2020-10-15 DIAGNOSIS — G8929 Other chronic pain: Secondary | ICD-10-CM | POA: Diagnosis not present

## 2020-10-15 MED ORDER — DICLOFENAC SODIUM 75 MG PO TBEC: 75.0000 mg | DELAYED_RELEASE_TABLET | Freq: Two times a day (BID) | ORAL | 0 refills | Status: DC

## 2020-10-15 MED ORDER — METHYLPREDNISOLONE ACETATE 80 MG/ML IJ SUSP
80.0000 mg | Freq: Once | INTRAMUSCULAR | Status: AC
  Administered 2020-10-15: 80 mg via INTRAMUSCULAR

## 2020-10-15 MED ORDER — CYCLOBENZAPRINE HCL 5 MG PO TABS: 5.0000 mg | ORAL_TABLET | Freq: Three times a day (TID) | ORAL | 1 refills | Status: DC | PRN

## 2020-10-15 NOTE — Telephone Encounter (Signed)
I called the patient.  MRI of the brain is relatively unremarkable, single pinhead spot in the left frontal area is noted.  Description of the blackout seems most consistent with vasovagal syncope.    MRI brain 10/14/20:  IMPRESSION:   This MRI of the brain with and without contrast shows the following: 1.   A single punctate focus of T2/flair hyperintensity in the subcortical white matter of the left frontal lobe.  This is unlikely to be clinically significant and is probably caused by very minimal chronic microvascular ischemic change or sequela of migraine headache. 2.   The pituitary tissue is thinned with in a mildly enlarged sella turcica consistent with a "partially empty sella".  This is often an incidental finding but could also be due to elevated intracranial pressure. 3.   No acute findings.  Normal enhancement pattern.

## 2020-10-15 NOTE — Patient Instructions (Addendum)
[name] -   Let me know if pain gets worse or doesn't get better  Steroid shot today  Muscle relaxer and antiinflammatory to the pharmacy  PT referral and MRI order been sent  Thank you  Rich     If you have lab work done today you will be contacted with your lab results within the next 2 weeks.  If you have not heard from [place] then please contact us. The fastest way to get your results is to register for My Chart.   IF you received an x-ray today, you will receive an invoice from [facility]. Please contact [facility] at [phone] with questions or concerns regarding your invoice.   IF you received labwork today, you will receive an invoice from [facility]. Please contact [facility] at [phone] with questions or concerns regarding your invoice.   Our billing staff will not be able to assist you with questions regarding bills from these companies.  You will be contacted with the lab results as soon as they are available. The fastest way to get your results is to activate your My Chart account. Instructions are located on the last page of this paperwork. If you have not heard from [place] regarding the results in 2 weeks, please contact this office.

## 2020-10-15 NOTE — Progress Notes (Signed)
Acute Office Visit  Subjective:    Patient ID: [name], female    DOB: 10/30/1985, 35 y.o.   MRN: [id number]  Chief Complaint  Patient presents with   Back Pain    Patient states she is here for some back pain this morning. She thinks it her sciatica because the pain is shooting upward and down on the right side.    HPI Patient is in today for back pain  Acute on chronic Locked up yesterday after long day at work of bending over Pain shooting down R leg Getting worse today - had to leave work   Longstanding hx of lower back pain Recent xray shows degenerative changes in lumbar spine, unable to totally characterize, recommended Mri for follow up   Pt has done PT in the past with mixed effect  No saddle symptoms  Otherwise no acute concerns  Past Medical History:  Diagnosis Date   Anxiety    Arthritis    Asthma    Back pain    low    Back problem    disc problem cervical, thoracic, lumbar    Depression    Drug abuse (HCC)    Headache(784.0)    Hypothyroidism    Migraine    Sciatic leg pain     Past Surgical History:  Procedure Laterality Date   CESAREAN SECTION     CESAREAN SECTION N/A 11/16/2012   Procedure: CESAREAN SECTION;  Surgeon: Bing Plume, MD;  Location: [facility];  Service: Obstetrics;  Laterality: N/A;  REPEAT   WISDOM TOOTH EXTRACTION     WRIST SURGERY      Family History  Problem Relation Age of Onset   Arthritis Mother    Arthritis Father    Asthma Son    Seizures Neg Hx     Social History   Socioeconomic History   Marital status: Divorced    Spouse name: Not on file   Number of children: Not on file   Years of education: Not on file   Highest education level: Not on file  Occupational History    Employer: [employer]  Tobacco Use   Smoking status: Some Days    Pack years: 0.00    Types: Cigarettes   Smokeless tobacco: Never  Vaping Use   Vaping Use: Former  Substance and Sexual Activity   Alcohol use: Yes     Comment: "on occasion"   Drug use: Yes    Types: Marijuana    Comment: "occasionally"   Sexual activity: Not on file  Other Topics Concern   Not on file  Social History Narrative   Resides with daughter, son, boyfriend and his daughter   Right handed   Caffeine: 2-3 cups/day   Social Determinants of Health   Financial Resource Strain: Not on file  Food Insecurity: Not on file  Transportation Needs: Not on file  Physical Activity: Not on file  Stress: Not on file  Social Connections: Not on file  Intimate Partner Violence: Not on file    Outpatient Medications Prior to Visit  Medication Sig Dispense Refill   hydrOXYzine (ATARAX/VISTARIL) 10 MG tablet Take 0.5-1 tablets (5-10 mg total) by mouth 3 (three) times daily as needed. 30 tablet 0   levothyroxine (SYNTHROID) 50 MCG tablet Take 1 tablet (50 mcg total) by mouth daily. 90 tablet 0   escitalopram (LEXAPRO) 10 MG tablet Take 1 tablet (10 mg total) by mouth daily. Can increase to 2 tablets (20mg  total)  by mouth daily after 2 weeks if tolerated well. (Patient not taking: No sig reported) 90 tablet 0   No facility-administered medications prior to visit.    Allergies  Allergen Reactions   Latex Rash   Oxycodone-Acetaminophen Rash    Per RN- pt can tolerate vicodin   Sulfa Antibiotics Rash and Photosensitivity   Tape Rash    Review of Systems  Constitutional: Negative.   HENT: Negative.    Eyes: Negative.   Respiratory: Negative.    Cardiovascular: Negative.   Gastrointestinal: Negative.   Genitourinary: Negative.   Musculoskeletal:  Positive for back pain. Negative for arthralgias, gait problem, joint swelling, myalgias, neck pain and neck stiffness.  Skin: Negative.   Psychiatric/Behavioral: Negative.    All other systems reviewed and are negative.     Objective:    Physical Exam Vitals and nursing note reviewed.  Constitutional:      General: She is not in acute distress.    Appearance: Normal  appearance. She is not ill-appearing, toxic-appearing or diaphoretic.  Cardiovascular:     Rate and Rhythm: Normal rate and regular rhythm.     Pulses: Normal pulses.     Heart sounds: Normal heart sounds. No murmur heard.   No friction rub. No gallop.  Pulmonary:     Effort: Pulmonary effort is normal. No respiratory distress.     Breath sounds: Normal breath sounds. No stridor. No wheezing, rhonchi or rales.  Chest:     Chest wall: No tenderness.  Musculoskeletal:        General: Tenderness present. No swelling, deformity or signs of injury. Normal range of motion.     Right lower leg: No edema.     Left lower leg: No edema.     Comments: Positive straight leg raise R side  Skin:    General: Skin is warm and dry.     Capillary Refill: Capillary refill takes less than 2 seconds.  Neurological:     General: No focal deficit present.     Mental Status: She is alert and oriented to person, place, and time. Mental status is at baseline.  Psychiatric:        Mood and Affect: Mood normal.        Behavior: Behavior normal.        Thought Content: Thought content normal.        Judgment: Judgment normal.    Pulse 77   Temp 98.3 F (36.8 C) (Temporal)   Resp 17   Ht 5\' 5"  (1.651 m)   Wt 191 lb (86.6 kg)   SpO2 (!) 9%   BMI 31.78 kg/m  Wt Readings from Last 3 Encounters:  10/15/20 191 lb (86.6 kg)  10/02/20 191 lb (86.6 kg)  09/20/20 188 lb 12.8 oz (85.6 kg)    There are no preventive care reminders to display for this patient.  There are no preventive care reminders to display for this patient.   Lab Results  Component Value Date   TSH 4.88 (H) 09/20/2020   Lab Results  Component Value Date   WBC 8.2 09/20/2020   HGB 13.8 09/20/2020   HCT 41.0 09/20/2020   MCV 96.5 09/20/2020   PLT 371 09/20/2020   Lab Results  Component Value Date   NA 138 09/20/2020   K 4.1 09/20/2020   CO2 22 09/20/2020   GLUCOSE 90 09/20/2020   BUN 14 09/20/2020   CREATININE 0.92  09/20/2020   BILITOT 0.5 09/20/2020   ALKPHOS 61  04/13/2018   AST 11 09/20/2020   ALT 7 09/20/2020   PROT 7.1 09/20/2020   ALBUMIN 4.6 04/13/2018   CALCIUM 9.8 09/20/2020   GFR 89.56 04/13/2018   Lab Results  Component Value Date   CHOL 180 04/13/2018   Lab Results  Component Value Date   HDL 38.80 (L) 04/13/2018   No results found for: [facility] Lab Results  Component Value Date   TRIG 213.0 (H) 04/13/2018   Lab Results  Component Value Date   CHOLHDL 5 04/13/2018   Lab Results  Component Value Date   HGBA1C 4.9 09/20/2020       Assessment & Plan:   Problem List Items Addressed This Visit   None Visit Diagnoses     Chronic midline low back pain with right-sided sciatica    -  Primary   Relevant Medications   methylPREDNISolone acetate (DEPO-MEDROL) injection 80 mg   cyclobenzaprine (FLEXERIL) 5 MG tablet   diclofenac (VOLTAREN) 75 MG EC tablet   Other Relevant Orders   MR Lumbar Spine W Wo Contrast   Ambulatory referral to Physical Therapy        Meds ordered this encounter  Medications   methylPREDNISolone acetate (DEPO-MEDROL) injection 80 mg   cyclobenzaprine (FLEXERIL) 5 MG tablet    Sig: Take 1 tablet (5 mg total) by mouth 3 (three) times daily as needed for muscle spasms.    Dispense:  30 tablet    Refill:  1    Order Specific Question:   Supervising Provider    Answer:   Neva Seat, JEFFREY R [[id number]]   diclofenac (VOLTAREN) 75 MG EC tablet    Sig: Take 1 tablet (75 mg total) by mouth 2 (two) times daily.    Dispense:  30 tablet    Refill:  0    Order Specific Question:   Supervising Provider    Answer:   Neva Seat, JEFFREY R [[id number]]   PLAN Depo medrol injection in office Diclofenac and flexeril as directed for relief MRI to follow up on xray Refer to PT  Return precautions reviewed Patient encouraged to call clinic with any questions, comments, or concerns.   Janeece Agee, NP

## 2020-10-18 ENCOUNTER — Encounter: Payer: Self-pay | Admitting: Registered Nurse

## 2020-10-22 ENCOUNTER — Other Ambulatory Visit: Payer: Self-pay

## 2020-10-22 ENCOUNTER — Encounter: Payer: Self-pay | Admitting: Registered Nurse

## 2020-10-22 ENCOUNTER — Ambulatory Visit: Payer: 59 | Admitting: Registered Nurse

## 2020-10-22 VITALS — BP 121/75 | HR 67 | Temp 98.2°F | Resp 18 | Ht 65.0 in | Wt 190.0 lb

## 2020-10-22 DIAGNOSIS — M5441 Lumbago with sciatica, right side: Secondary | ICD-10-CM

## 2020-10-22 DIAGNOSIS — Z7689 Persons encountering health services in other specified circumstances: Secondary | ICD-10-CM

## 2020-10-22 DIAGNOSIS — G8929 Other chronic pain: Secondary | ICD-10-CM

## 2020-10-22 MED ORDER — DICLOFENAC SODIUM 1 % EX GEL: 2.0000 g | Freq: Four times a day (QID) | CUTANEOUS | 2 refills | Status: DC

## 2020-10-22 NOTE — Progress Notes (Signed)
Established Patient Office Visit  Subjective:  Patient ID: [name], female    DOB: 1985-05-24  Age: 35 y.o. MRN: [id number]  CC:  Chief Complaint  Patient presents with   Transitions Of Care    Patient states she is here for a TOC. Patient states she is still having some back pain but MRI for 10/25/2020    HPI [name] presents for [facility]  Fairly well known to me at this point - have seen her for a few acute issues.  Notes ongoing back pain, unfortunately is not in a job where she can be too limited from a physical standpoint. Recent documentation from her employer states that she is required per her job description to be able to stay on her feet for minimum 6 hours daily and lift heavy objects consistently. Still does not feel able to do this. No progressive or worsening symptoms at this time, continues to deny saddle symptoms.   We have had her on diclofenac and flexeril, which have only helped somewhat.   Histories reviewed and updated with patient.   Migraines Thought to be menstrual - has changed COC regimen to adapt to this. Some progress. Had been on migraine meds in past - around 14 - 15 years ago, no interest in pursuing medication today.  Drug abuse Ages [age], crack and cocaine Has not been on any illicit substances since Occ marijuana use  Past Medical History:  Diagnosis Date   Anxiety    Arthritis    Asthma    Back pain    low    Back problem    disc problem cervical, thoracic, lumbar    Depression    Drug abuse (HCC)    Headache(784.0)    Hypothyroidism    Migraine    Sciatic leg pain     Past Surgical History:  Procedure Laterality Date   CESAREAN SECTION     CESAREAN SECTION N/A 11/16/2012   Procedure: CESAREAN SECTION;  Surgeon: Bing Plume, MD;  Location: [facility];  Service: Obstetrics;  Laterality: N/A;  REPEAT   WISDOM TOOTH EXTRACTION     WRIST SURGERY      Family History  Problem Relation Age of Onset   Arthritis Mother     Arthritis Father    Asthma Son    Seizures Neg Hx     Social History   Socioeconomic History   Marital status: Divorced    Spouse name: Not on file   Number of children: Not on file   Years of education: Not on file   Highest education level: Not on file  Occupational History    Employer: [employer]  Tobacco Use   Smoking status: Some Days    Pack years: 0.00    Types: Cigarettes   Smokeless tobacco: Never  Vaping Use   Vaping Use: Former  Substance and Sexual Activity   Alcohol use: Yes    Comment: "on occasion"   Drug use: Yes    Types: Marijuana    Comment: "occasionally"   Sexual activity: Not on file  Other Topics Concern   Not on file  Social History Narrative   Resides with daughter, son, boyfriend and his daughter   Right handed   Caffeine: 2-3 cups/day   Social Determinants of Health   Financial Resource Strain: Not on file  Food Insecurity: Not on file  Transportation Needs: Not on file  Physical Activity: Not on file  Stress: Not on file  Social Connections: Not on file  Intimate Partner Violence: Not on file    Outpatient Medications Prior to Visit  Medication Sig Dispense Refill   cyclobenzaprine (FLEXERIL) 5 MG tablet Take 1 tablet (5 mg total) by mouth 3 (three) times daily as needed for muscle spasms. 30 tablet 1   diclofenac (VOLTAREN) 75 MG EC tablet Take 1 tablet (75 mg total) by mouth 2 (two) times daily. 30 tablet 0   hydrOXYzine (ATARAX/VISTARIL) 10 MG tablet Take 0.5-1 tablets (5-10 mg total) by mouth 3 (three) times daily as needed. 30 tablet 0   levothyroxine (SYNTHROID) 50 MCG tablet Take 1 tablet (50 mcg total) by mouth daily. 90 tablet 0   No facility-administered medications prior to visit.    Allergies  Allergen Reactions   Latex Rash   Oxycodone-Acetaminophen Rash    Per RN- pt can tolerate vicodin   Sulfa Antibiotics Rash and Photosensitivity   Tape Rash    ROS Review of Systems  Constitutional: Negative.    HENT: Negative.    Eyes: Negative.   Respiratory: Negative.    Cardiovascular: Negative.   Gastrointestinal: Negative.   Genitourinary: Negative.   Musculoskeletal: Negative.   Skin: Negative.   Neurological: Negative.   Psychiatric/Behavioral: Negative.    All other systems reviewed and are negative.    Objective:    Physical Exam Vitals and nursing note reviewed.  Constitutional:      General: She is not in acute distress.    Appearance: Normal appearance. She is normal weight. She is not ill-appearing, toxic-appearing or diaphoretic.  Cardiovascular:     Rate and Rhythm: Normal rate and regular rhythm.     Heart sounds: Normal heart sounds. No murmur heard.   No friction rub. No gallop.  Pulmonary:     Effort: Pulmonary effort is normal. No respiratory distress.     Breath sounds: Normal breath sounds. No stridor. No wheezing, rhonchi or rales.  Chest:     Chest wall: No tenderness.  Skin:    General: Skin is warm and dry.  Neurological:     General: No focal deficit present.     Mental Status: She is alert and oriented to person, place, and time. Mental status is at baseline.  Psychiatric:        Mood and Affect: Mood normal.        Behavior: Behavior normal.        Thought Content: Thought content normal.        Judgment: Judgment normal.    BP 121/75   Pulse 67   Temp 98.2 F (36.8 C) (Temporal)   Resp 18   Ht 5\' 5"  (1.651 m)   Wt 190 lb (86.2 kg)   SpO2 98%   BMI 31.62 kg/m  Wt Readings from Last 3 Encounters:  10/22/20 190 lb (86.2 kg)  10/15/20 191 lb (86.6 kg)  10/02/20 191 lb (86.6 kg)     There are no preventive care reminders to display for this patient.  There are no preventive care reminders to display for this patient.  Lab Results  Component Value Date   TSH 4.88 (H) 09/20/2020   Lab Results  Component Value Date   WBC 8.2 09/20/2020   HGB 13.8 09/20/2020   HCT 41.0 09/20/2020   MCV 96.5 09/20/2020   PLT 371 09/20/2020   Lab  Results  Component Value Date   NA 138 09/20/2020   K 4.1 09/20/2020   CO2 22 09/20/2020   GLUCOSE  90 09/20/2020   BUN 14 09/20/2020   CREATININE 0.92 09/20/2020   BILITOT 0.5 09/20/2020   ALKPHOS 61 04/13/2018   AST 11 09/20/2020   ALT 7 09/20/2020   PROT 7.1 09/20/2020   ALBUMIN 4.6 04/13/2018   CALCIUM 9.8 09/20/2020   GFR 89.56 04/13/2018   Lab Results  Component Value Date   CHOL 180 04/13/2018   Lab Results  Component Value Date   HDL 38.80 (L) 04/13/2018   No results found for: [facility] Lab Results  Component Value Date   TRIG 213.0 (H) 04/13/2018   Lab Results  Component Value Date   CHOLHDL 5 04/13/2018   Lab Results  Component Value Date   HGBA1C 4.9 09/20/2020      Assessment & Plan:   Problem List Items Addressed This Visit   None Visit Diagnoses     Encounter to establish care    -  Primary   Chronic midline low back pain with right-sided sciatica       Relevant Medications   diclofenac Sodium (VOLTAREN) 1 % GEL       Meds ordered this encounter  Medications   diclofenac Sodium (VOLTAREN) 1 % GEL    Sig: Apply 2 g topically 4 (four) times daily.    Dispense:  100 g    Refill:  2    Order Specific Question:   Supervising Provider    Answer:   Neva Seat, JEFFREY R [[id number]]    Follow-up: No follow-ups on file.   PLAN Diclofenac topically. Pt has concerns about using analgesics and muscle relaxers - does not want to get addicted or use these problematically. Reassured that diclofenac po and flexeril are low potency medication that do not carry an abuse or dependence risk. Mri on Friday - will follow up as warranted Plan on PT Not  ok to return to work as of yet. May need new job given her employer's expectations of heavy lifting and 6+ hours on feet. Patient encouraged to call clinic with any questions, comments, or concerns.  Janeece Agee, NP

## 2020-10-22 NOTE — Patient Instructions (Signed)
° ° ° °  If you have lab work done today you will be contacted with your lab results within the next 2 weeks.  If you have not heard from us then please contact us. The fastest way to get your results is to register for My Chart. ° ° °IF you received an x-ray today, you will receive an invoice from [facility]. Please contact [facility] at [phone] with questions or concerns regarding your invoice.  ° °IF you received labwork today, you will receive an invoice from [facility]. Please contact [facility] at [phone] with questions or concerns regarding your invoice.  ° °Our billing staff will not be able to assist you with questions regarding bills from these companies. ° °You will be contacted with the lab results as soon as they are available. The fastest way to get your results is to activate your My Chart account. Instructions are located on the last page of this paperwork. If you have not heard from us regarding the results in 2 weeks, please contact this office. °  ° ° ° °

## 2020-10-25 ENCOUNTER — Ambulatory Visit
Admission: RE | Admit: 2020-10-25 | Discharge: 2020-10-25 | Disposition: A | Discharge status: Home / Self Care | Payer: 59 | Source: Ambulatory Visit | Attending: Registered Nurse | Admitting: Registered Nurse

## 2020-10-25 DIAGNOSIS — M5441 Lumbago with sciatica, right side: Secondary | ICD-10-CM

## 2020-10-25 MED ORDER — GADOBENATE DIMEGLUMINE 529 MG/ML IV SOLN
16.0000 mL | Freq: Once | INTRAVENOUS | Status: AC | PRN
  Administered 2020-10-25: 16 mL via INTRAVENOUS

## 2020-10-29 ENCOUNTER — Encounter: Payer: Self-pay | Admitting: Registered Nurse

## 2020-10-29 ENCOUNTER — Other Ambulatory Visit: Payer: Self-pay | Admitting: Registered Nurse

## 2020-10-29 DIAGNOSIS — M5441 Lumbago with sciatica, right side: Secondary | ICD-10-CM

## 2020-10-29 NOTE — Progress Notes (Signed)
Discussed MRI results with pt. 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 Discussed benefits and alternatives of neurosurg, PT, and ortho. Pt opts for PT. Referral placed.  Continue to limit work - no heavy lifting at this time. Ideal for sedentary work with minimal light lifting until improvement can be achieved. Work note written.  Jari Sportsman, NP

## 2020-11-04 ENCOUNTER — Encounter: Payer: Self-pay | Admitting: Physician Assistant

## 2020-11-22 ENCOUNTER — Ambulatory Visit: Payer: 59 | Admitting: Physical Therapy

## 2020-11-22 ENCOUNTER — Encounter: Payer: Self-pay | Admitting: Physical Therapy

## 2020-11-22 ENCOUNTER — Other Ambulatory Visit: Payer: Self-pay

## 2020-11-22 DIAGNOSIS — M5441 Lumbago with sciatica, right side: Secondary | ICD-10-CM

## 2020-11-22 DIAGNOSIS — M5442 Lumbago with sciatica, left side: Secondary | ICD-10-CM

## 2020-11-22 DIAGNOSIS — G8929 Other chronic pain: Secondary | ICD-10-CM

## 2020-11-22 NOTE — Patient Instructions (Signed)
Access Code: [email or web address] URL: [email or web address] Date: 11/22/2020 Prepared by: Sedalia Muta  Exercises Supine Posterior Pelvic Tilt - 2 x daily - 1-2 sets - 10 reps Supine Piriformis Stretch with Leg Straight - 2 x daily - 3 reps - 30 hold Supine Figure 4 Piriformis Stretch - 2 x daily - 3 reps - 30 hold Seated Piriformis Stretch with Trunk Bend - 2 x daily - 3 reps - 30 hold Seated Hamstring Stretch - 2 x daily - 3 reps - 30 hold

## 2020-11-24 ENCOUNTER — Encounter: Payer: Self-pay | Admitting: Physical Therapy

## 2020-11-24 NOTE — Therapy (Addendum)
[facility] [address] [place], [place], [address] Phone: [phone]   Fax:  [phone]  Physical Therapy Evaluation  Patient Details  Name: [name] MRN: [id number] Date of Birth: October 14, 1985 Referring Provider (PT): Maximiano Coss   Encounter Date: 11/22/2020   PT End of Session - 11/24/20 1536     Visit Number 1    Number of Visits 12    Date for PT Re-Evaluation 01/03/21    Authorization Type U[employer]    PT Start Time [id number]    PT Stop Time 0930    PT Time Calculation (min) 43 min    Activity Tolerance Patient tolerated treatment well    Behavior During Therapy [facility] for tasks assessed/performed             Past Medical History:  Diagnosis Date   Anxiety    Arthritis    Asthma    Back pain    low    Back problem    disc problem cervical, thoracic, lumbar    Depression    Drug abuse (HCC)    Headache(784.0)    Hypothyroidism    Migraine    Sciatic leg pain     Past Surgical History:  Procedure Laterality Date   CESAREAN SECTION     CESAREAN SECTION N/A 11/16/2012   Procedure: CESAREAN SECTION;  Surgeon: Melina Schools, MD;  Location: [facility];  Service: Obstetrics;  Laterality: N/A;  REPEAT   IUD REMOVAL     WISDOM TOOTH EXTRACTION     WRIST SURGERY      There were no vitals filed for this visit.    Subjective Assessment - 11/24/20 1530     Subjective Pt states start of back pain after having her 1st cchild, 15 y ago. Mostly R side, with sciatic pain.  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 Pain has worsened recently. Pt has has 3 kids, 4 dogs, that she cares for . States signfiicant pain with getting out of bed in AM, housework, bending, squatting.  Sitting painful. standing better.  Also has other Joint pain, hands, toes, knees for about 6 mo. and neck pain . Both feet swollen and painful at end of day.  Pt  notes episode in May where she passed out, has had workup (negative),and is seeing Neurology,  states she thinks BP dropped. Does recall this happening at least a few times in the past, where she thought she was going to pass out, goes to lay down, and symptoms pass after a while.    Pertinent History recent x-ray: IMPRESSION:  1. Degenerative changes of the lower lumbar spine, more pronounced  at L5-S1 where there is moderate right and mild left neural  foraminal narrowing.  2. No high-grade spinal canal stenosis at any level.    Limitations Sitting;Lifting;Standing;Walking;House hold activities    Patient Stated Goals decreased pain in back.    Currently in Pain? Yes    Pain Score 10-Worst pain ever    Pain Location Back    Pain Orientation Left;Right    Pain Descriptors / Indicators Aching    Pain Type Chronic pain    Pain Radiating Towards into posterior thighs (bil)    Pain Onset More than a month ago    Pain Frequency Intermittent    Aggravating Factors  worse in AMs, sitting, squatting, bending.  Pain Relieving Factors none stated , likes piriformis stretch.                [facility] PT Assessment - 11/24/20 0001       Assessment   Medical Diagnosis Back pain    Referring Provider (PT) Maximiano Coss    Prior Therapy no      Precautions   Precautions Other (comment)    Precaution Comments recent episode of syncope      Balance Screen   Has the patient fallen in the past 6 months No      Prior Function   Level of Independence Independent      Cognition   Overall Cognitive Status Within Functional Limits for tasks assessed      ROM / Strength   AROM / PROM / Strength AROM;Strength      AROM   Overall AROM Comments Lumbar: WFL, Hips: WNL, Knees: WNL, Ankles: WNL;      Strength   Overall Strength Comments Hips: 4-/5, Knees: 4+/5, Core: 3+/5;      Palpation   Palpation comment Pain in R lumbar region, into R SI, R gltue >L.                         Objective measurements completed on examination: See above findings.       [facility] PT Treatment/Exercise - 11/24/20 0001       Exercises   Exercises Lumbar      Lumbar Exercises: Stretches   Active Hamstring Stretch 3 reps;30 seconds    Active Hamstring Stretch Limitations seated    Pelvic Tilt 20 reps    Piriformis Stretch 3 reps;30 seconds    Piriformis Stretch Limitations supine and seated                    PT Education - 11/24/20 1536     Education Details PT POC, Exam findings, HEP    Person(s) Educated Patient    Methods Explanation;Tactile cues;Demonstration;Verbal cues;Handout    Comprehension Verbalized understanding;Returned demonstration;Verbal cues required;Tactile cues required;Need further instruction              PT Short Term Goals - 11/24/20 1538       PT SHORT TERM GOAL #1   Title Pt to be independent with intiial HEP    Time 2    Period Weeks    Status New    Target Date 12/06/20               PT Long Term Goals - 11/24/20 1539       PT LONG TERM GOAL #1   Title Pt to be independent with final HEP    Time 6    Period Weeks    Status New    Target Date 01/03/21      PT LONG TERM GOAL #2   Title Pt to report decreased pain in back and down LEs, to 0-4/10 with sitting and standing activities.    Time 6    Period Weeks    Status New    Target Date 01/03/21      PT LONG TERM GOAL #3   Title Pt to demo optimal squat mechanics for improved ability and safety with functional activies and IADLs .    Time 6    Period Weeks    Status New    Target Date 01/03/21      PT LONG TERM GOAL #  4   Title Pt to demo improved strength of bil hips and core to at least 4+/5 to improve stability and pain .    Time 6    Period Weeks    Status New    Target Date 01/03/21                    Plan - 11/24/20 1546     Clinical Impression Statement Pt presents with primary complaint of increased  pain in low back, into LEs. Pt with significant and ongoing pain. She has limited ability for bending, sitting, and most functional activities. Pt with weakness in hips and core, and has lack of effective HEP for her Dx. Pt to benefit from skilled PT to improve deficits and return to increased functional activiy without pain.    Personal Factors and Comorbidities Time since onset of injury/illness/exacerbation    Examination-Activity Limitations Lift;Stand;Bed Mobility;Locomotion Level;Bend;Transfers;Caring for Others;Carry;Sit;Sleep;Squat;Stairs    Examination-Participation Restrictions Cleaning;Meal Prep;Yard Work;Community Activity;Driving;Shop    Stability/Clinical Decision Making Stable/Uncomplicated    Clinical Decision Making Low    Rehab Potential Good    PT Frequency 2x / week    PT Duration 6 weeks    PT Treatment/Interventions ADLs/Self Care Home Management;Cryotherapy;Electrical Stimulation;Iontophoresis 59m/ml Dexamethasone;Moist Heat;Traction;Ultrasound;DME Instruction;Therapeutic activities;Functional mobility training;Gait training;Neuromuscular re-education;Cognitive remediation;Patient/family education;Manual techniques;Passive range of motion;Dry needling;Taping;Vasopneumatic Device;Spinal Manipulations;Joint Manipulations    PT Home Exercise Plan [id number]    [id number] Agree with Plan of Care Patient             Patient will benefit from skilled therapeutic intervention in order to improve the following deficits and impairments:  Pain, Improper body mechanics, Decreased mobility, Increased muscle spasms, Decreased activity tolerance, Decreased range of motion, Decreased strength, Impaired flexibility, Decreased safety awareness  Visit Diagnosis: Chronic bilateral low back pain with bilateral sciatica     Problem List Patient Active Problem List   Diagnosis Date Noted   Syncope, convulsive 10/02/2020   Tobacco dependence due to cigarettes 04/14/2018   Sciatica,  left 04/13/2018    LLyndee Hensen PT, DPT 4:18 PM  11/24/20    [place] [address]Phone: [phone]  Fax:  [phone] Name: [name] MRN: [id number] of Birth: [birth year] PHYSICAL THERAPY DISCHARGE SUMMARY  Visits from Start of Care:1 Plan: Patient agrees to discharge.  Patient goals were not met. Patient is being discharged due to - not returning since last visit .    LLyndee Hensen PT, DPT 10:45 AM  04/30/21

## 2020-12-03 ENCOUNTER — Encounter: Payer: 59 | Admitting: Physical Therapy

## 2020-12-10 ENCOUNTER — Encounter: Payer: 59 | Admitting: Physical Therapy

## 2020-12-12 ENCOUNTER — Ambulatory Visit: Payer: 59 | Admitting: Neurology

## 2020-12-17 ENCOUNTER — Encounter: Payer: Self-pay | Admitting: Physical Therapy

## 2020-12-27 ENCOUNTER — Other Ambulatory Visit: Payer: Self-pay | Admitting: Registered Nurse

## 2020-12-27 DIAGNOSIS — E039 Hypothyroidism, unspecified: Secondary | ICD-10-CM

## 2021-03-20 ENCOUNTER — Other Ambulatory Visit: Payer: Self-pay | Admitting: Registered Nurse

## 2021-03-20 DIAGNOSIS — E039 Hypothyroidism, unspecified: Secondary | ICD-10-CM

## 2021-03-21 NOTE — Telephone Encounter (Signed)
Patient needs a follow up appointment once scheduled can send in temporary fill.

## 2021-03-26 ENCOUNTER — Other Ambulatory Visit: Payer: Self-pay

## 2021-03-26 ENCOUNTER — Ambulatory Visit (INDEPENDENT_AMBULATORY_CARE_PROVIDER_SITE_OTHER): Payer: Self-pay | Admitting: Registered Nurse

## 2021-03-26 ENCOUNTER — Telehealth: Payer: Self-pay | Admitting: Registered Nurse

## 2021-03-26 ENCOUNTER — Encounter: Payer: Self-pay | Admitting: Registered Nurse

## 2021-03-26 VITALS — BP 125/91 | HR 84 | Temp 98.3°F | Resp 18 | Ht 65.0 in | Wt 204.2 lb

## 2021-03-26 DIAGNOSIS — J45998 Other asthma: Secondary | ICD-10-CM

## 2021-03-26 DIAGNOSIS — E538 Deficiency of other specified B group vitamins: Secondary | ICD-10-CM

## 2021-03-26 DIAGNOSIS — R233 Spontaneous ecchymoses: Secondary | ICD-10-CM

## 2021-03-26 DIAGNOSIS — E039 Hypothyroidism, unspecified: Secondary | ICD-10-CM

## 2021-03-26 LAB — CBC
HCT: 41.1 % (ref 36.0–46.0)
Hemoglobin: 13.9 g/dL (ref 12.0–15.0)
MCHC: 33.9 g/dL (ref 30.0–36.0)
MCV: 95.3 fl (ref 78.0–100.0)
Platelets: 355 10*3/uL (ref 150.0–400.0)
RBC: 4.31 Mil/uL (ref 3.87–5.11)
RDW: 12.8 % (ref 11.5–15.5)
WBC: 10 10*3/uL (ref 4.0–10.5)

## 2021-03-26 LAB — TSH: TSH: 2.48 u[IU]/mL (ref 0.35–5.50)

## 2021-03-26 LAB — VITAMIN B12: Vitamin B-12: 101 pg/mL — ABNORMAL LOW (ref 211–911)

## 2021-03-26 MED ORDER — ALBUTEROL SULFATE HFA 108 (90 BASE) MCG/ACT IN AERS: 2.0000 | INHALATION_SPRAY | Freq: Four times a day (QID) | RESPIRATORY_TRACT | 2 refills | Status: AC | PRN

## 2021-03-26 NOTE — Telephone Encounter (Signed)
Pt is going to keep her appt today and will come in for bw

## 2021-03-26 NOTE — Patient Instructions (Addendum)
Ms. Caldas -   Randie Heinz to see you!  We'll check on TSH and B12 today. I'll let you know how they look by tomorrow and we can adjust dosing as needed on the synthroid  If b12 is ok, you can stop the supplement.  Will check on CBC today as well to investigate bruising.   Keep up the great work with healthy diet choices and regular exercise.  I have sent an inhaler to your pharmacy in case you need it.  See you in 6 months, sooner with concerns.  Thanks!  Rich     If you have lab work done today you will be contacted with your lab results within the next 2 weeks.  If you have not heard from [place] then please contact us. The fastest way to get your results is to register for My Chart.   IF you received an x-ray today, you will receive an invoice from [facility]. Please contact [facility] at [phone] with questions or concerns regarding your invoice.   IF you received labwork today, you will receive an invoice from [place]. Please contact [facility] at [phone] with questions or concerns regarding your invoice.   Our billing staff will not be able to assist you with questions regarding bills from these companies.  You will be contacted with the lab results as soon as they are available. The fastest way to get your results is to activate your My Chart account. Instructions are located on the last page of this paperwork. If you have not heard from [place] regarding the results in 2 weeks, please contact this office.

## 2021-03-26 NOTE — Progress Notes (Signed)
Established Patient Office Visit  Subjective:  Patient ID: [name], female    DOB: 15-May-1985  Age: 35 y.o. MRN: [id number]  CC:  Chief Complaint  Patient presents with   Medication Refill    Patient is here to get a mediation refill and check her thyroid. Patient states the B12 is making her sick and she has to take it    HPI [name] [place] presents for med refill, labs, thyroid  Hypothyroid Taking synthroid po qd Good effect, no AE. No symptoms of thyroid dysfunction Lab Results  Component Value Date   TSH 4.88 (H) 09/20/2020    Low b12 Lab Results  Component Value Date   VITAMINB12 176 (L) 09/20/2020   Has been taking OTC supplement Notes nausea and heartburn with this. Would like to recheck.  Seasonal asthma Ongoing. Used albuterol in the past with good effect.  Needs refill.   Otherwise no acute concerns at this time.   Past Medical History:  Diagnosis Date   Anxiety    Arthritis    Asthma    Back pain    low    Back problem    disc problem cervical, thoracic, lumbar    Depression    Drug abuse (HCC)    Headache(784.0)    Hypothyroidism    Migraine    Sciatic leg pain     Past Surgical History:  Procedure Laterality Date   CESAREAN SECTION     CESAREAN SECTION N/A 11/16/2012   Procedure: CESAREAN SECTION;  Surgeon: Bing Plume, MD;  Location: [facility];  Service: Obstetrics;  Laterality: N/A;  REPEAT   IUD REMOVAL     WISDOM TOOTH EXTRACTION     WRIST SURGERY      Family History  Problem Relation Age of Onset   Diabetes Mother    Arthritis Mother    Arthritis Father    Diabetes Maternal Grandmother    Diabetes Maternal Grandfather    Asthma Son    Seizures Neg Hx     Social History   Socioeconomic History   Marital status: Divorced    Spouse name: Not on file   Number of children: Not on file   Years of education: Not on file   Highest education level: Not on file  Occupational History    Employer: [employer]  Tobacco Use   Smoking status: Every Day    Packs/day: 0.50    Types: Cigarettes    Start date: 2012   Smokeless tobacco: Never  Vaping Use   Vaping Use: Former  Substance and Sexual Activity   Alcohol use: Yes    Comment: "on occasion"   Drug use: Yes    Types: Marijuana    Comment: "occasionally"   Sexual activity: Not on file  Other Topics Concern   Not on file  Social History Narrative   Resides with daughter, son, boyfriend and his daughter   Right handed   Caffeine: 2-3 cups/day   Social Determinants of Health   Financial Resource Strain: Not on file  Food Insecurity: Not on file  Transportation Needs: Not on file  Physical Activity: Not on file  Stress: Not on file  Social Connections: Not on file  Intimate Partner Violence: Not on file    Outpatient Medications Prior to Visit  Medication Sig Dispense Refill   cyclobenzaprine (FLEXERIL) 5 MG tablet Take 1 tablet (5 mg total) by mouth 3 (three) times daily as needed for muscle spasms.  30 tablet 1   diclofenac (VOLTAREN) 75 MG EC tablet Take 1 tablet (75 mg total) by mouth 2 (two) times daily. 30 tablet 0   diclofenac Sodium (VOLTAREN) 1 % GEL Apply 2 g topically 4 (four) times daily. 100 g 2   hydrOXYzine (ATARAX/VISTARIL) 10 MG tablet Take 0.5-1 tablets (5-10 mg total) by mouth 3 (three) times daily as needed. 30 tablet 0   levothyroxine (SYNTHROID) 50 MCG tablet TAKE 1 TABLET BY MOUTH EVERY DAY 90 tablet 0   No facility-administered medications prior to visit.    Allergies  Allergen Reactions   Latex Rash   Oxycodone-Acetaminophen Rash    Per RN- pt can tolerate vicodin   Sulfa Antibiotics Rash and Photosensitivity   Tape Rash    ROS Review of Systems  Constitutional: Negative.   HENT: Negative.    Eyes: Negative.   Respiratory: Negative.    Cardiovascular: Negative.   Gastrointestinal: Negative.   Genitourinary: Negative.   Musculoskeletal: Negative.   Skin: Negative.   Neurological:  Negative.   Psychiatric/Behavioral: Negative.    All other systems reviewed and are negative.    Objective:    Physical Exam Vitals and nursing note reviewed.  Constitutional:      General: She is not in acute distress.    Appearance: Normal appearance. She is normal weight. She is not ill-appearing, toxic-appearing or diaphoretic.  Cardiovascular:     Rate and Rhythm: Normal rate and regular rhythm.     Heart sounds: Normal heart sounds. No murmur heard.   No friction rub. No gallop.  Pulmonary:     Effort: Pulmonary effort is normal. No respiratory distress.     Breath sounds: Normal breath sounds. No stridor. No wheezing, rhonchi or rales.  Chest:     Chest wall: No tenderness.  Skin:    General: Skin is warm and dry.  Neurological:     General: No focal deficit present.     Mental Status: She is alert and oriented to person, place, and time. Mental status is at baseline.  Psychiatric:        Mood and Affect: Mood normal.        Behavior: Behavior normal.        Thought Content: Thought content normal.        Judgment: Judgment normal.    BP (!) 125/91   Pulse 84   Temp 98.3 F (36.8 C) (Temporal)   Resp 18   Ht 5\' 5"  (1.651 m)   Wt 204 lb 3.2 oz (92.6 kg)   SpO2 99%   BMI 33.98 kg/m  Wt Readings from Last 3 Encounters:  03/26/21 204 lb 3.2 oz (92.6 kg)  10/22/20 190 lb (86.2 kg)  10/15/20 191 lb (86.6 kg)     Health Maintenance Due  Topic Date Due   COVID-19 Vaccine (1) Never done   PAP SMEAR-Modifier  Never done   INFLUENZA VACCINE  Never done    There are no preventive care reminders to display for this patient.  Lab Results  Component Value Date   TSH 4.88 (H) 09/20/2020   Lab Results  Component Value Date   WBC 8.2 09/20/2020   HGB 13.8 09/20/2020   HCT 41.0 09/20/2020   MCV 96.5 09/20/2020   PLT 371 09/20/2020   Lab Results  Component Value Date   NA 138 09/20/2020   K 4.1 09/20/2020   CO2 22 09/20/2020   GLUCOSE 90 09/20/2020    BUN 14 09/20/2020  CREATININE 0.92 09/20/2020   BILITOT 0.5 09/20/2020   ALKPHOS 61 04/13/2018   AST 11 09/20/2020   ALT 7 09/20/2020   PROT 7.1 09/20/2020   ALBUMIN 4.6 04/13/2018   CALCIUM 9.8 09/20/2020   GFR 89.56 04/13/2018   Lab Results  Component Value Date   CHOL 180 04/13/2018   Lab Results  Component Value Date   HDL 38.80 (L) 04/13/2018   No results found for: [facility] Lab Results  Component Value Date   TRIG 213.0 (H) 04/13/2018   Lab Results  Component Value Date   CHOLHDL 5 04/13/2018   Lab Results  Component Value Date   HGBA1C 4.9 09/20/2020      Assessment & Plan:   Problem List Items Addressed This Visit   None Visit Diagnoses     Low serum vitamin B12    -  Primary   Relevant Orders   B12   Hypothyroidism, unspecified type       Relevant Orders   TSH   Easy bruising       Relevant Orders   CBC   Seasonal asthma       Relevant Medications   albuterol (VENTOLIN HFA) 108 (90 Base) MCG/ACT inhaler       Meds ordered this encounter  Medications   albuterol (VENTOLIN HFA) 108 (90 Base) MCG/ACT inhaler    Sig: Inhale 2 puffs into the lungs every 6 (six) hours as needed for wheezing or shortness of breath.    Dispense:  8 g    Refill:  2    Order Specific Question:   Supervising Provider    Answer:   Neva Seat, JEFFREY R [[id number]]    Follow-up: Return in about 6 months (around 09/23/2021) for thyroid.   PLAN Labs as above. Adjust synthroid based on lab results Return in 6 mo for recheck thyroid Patient encouraged to call clinic with any questions, comments, or concerns.  Janeece Agee, NP

## 2021-03-26 NOTE — Telephone Encounter (Signed)
With abnormal TSH we should check every 3-6 mo to ensure stability. Particularly over-replenishment of thyroid hormone can be dangerous.  Thanks,  Luan Pulling

## 2021-03-26 NOTE — Telephone Encounter (Signed)
Pt is confused as to when she needs to f/up she states that she just had BW done in May just saw Richard in June. She states that she was told my our office in order to get more refills on the thyroid medication she needs an OV.  Please advise can we cancel the appt today and just send in refills, pt is self pay and really can't afford an office visit and pay for her meds.  Please advise

## 2021-03-28 ENCOUNTER — Encounter: Payer: Self-pay | Admitting: Registered Nurse

## 2021-03-28 NOTE — Telephone Encounter (Signed)
Pt is agreeable to B12 injections reports possible insurance coverage soon, would pt be advised to have home administration or is in office administration better?

## 2021-03-31 ENCOUNTER — Other Ambulatory Visit: Payer: Self-pay | Admitting: Registered Nurse

## 2021-03-31 DIAGNOSIS — E538 Deficiency of other specified B group vitamins: Secondary | ICD-10-CM

## 2021-03-31 MED ORDER — CYANOCOBALAMIN 1000 MCG/ML IJ SOLN: 1000.0000 ug | INTRAMUSCULAR | 1 refills | Status: DC

## 2021-04-01 ENCOUNTER — Other Ambulatory Visit: Payer: Self-pay

## 2021-04-01 DIAGNOSIS — E039 Hypothyroidism, unspecified: Secondary | ICD-10-CM

## 2021-04-01 MED ORDER — LEVOTHYROXINE SODIUM 50 MCG PO TABS: 50.0000 ug | ORAL_TABLET | Freq: Every day | ORAL | 0 refills | Status: DC

## 2021-05-19 ENCOUNTER — Encounter: Payer: Self-pay | Admitting: Registered Nurse

## 2021-06-28 ENCOUNTER — Other Ambulatory Visit: Payer: Self-pay | Admitting: Registered Nurse

## 2021-06-28 DIAGNOSIS — E039 Hypothyroidism, unspecified: Secondary | ICD-10-CM

## 2021-09-29 DIAGNOSIS — M79674 Pain in right toe(s): Secondary | ICD-10-CM | POA: Diagnosis not present

## 2021-09-30 ENCOUNTER — Ambulatory Visit: Payer: Medicaid Other | Admitting: Registered Nurse

## 2021-10-01 DIAGNOSIS — S91111A Laceration without foreign body of right great toe without damage to nail, initial encounter: Secondary | ICD-10-CM | POA: Diagnosis not present

## 2021-10-01 DIAGNOSIS — M79671 Pain in right foot: Secondary | ICD-10-CM | POA: Diagnosis not present

## 2021-10-06 ENCOUNTER — Other Ambulatory Visit: Payer: Self-pay | Admitting: Registered Nurse

## 2021-10-06 DIAGNOSIS — E039 Hypothyroidism, unspecified: Secondary | ICD-10-CM

## 2021-10-13 DIAGNOSIS — Z130 Encounter for screening for diseases of the blood and blood-forming organs and certain disorders involving the immune mechanism: Secondary | ICD-10-CM | POA: Diagnosis not present

## 2021-10-13 DIAGNOSIS — Z8742 Personal history of other diseases of the female genital tract: Secondary | ICD-10-CM | POA: Diagnosis not present

## 2021-10-13 DIAGNOSIS — Z3040 Encounter for surveillance of contraceptives, unspecified: Secondary | ICD-10-CM | POA: Diagnosis not present

## 2021-10-13 DIAGNOSIS — R87610 Atypical squamous cells of undetermined significance on cytologic smear of cervix (ASC-US): Secondary | ICD-10-CM | POA: Diagnosis not present

## 2021-10-13 DIAGNOSIS — Z01419 Encounter for gynecological examination (general) (routine) without abnormal findings: Secondary | ICD-10-CM | POA: Diagnosis not present

## 2021-10-13 DIAGNOSIS — R87810 Cervical high risk human papillomavirus (HPV) DNA test positive: Secondary | ICD-10-CM | POA: Diagnosis not present

## 2021-10-13 DIAGNOSIS — Z6837 Body mass index (BMI) 37.0-37.9, adult: Secondary | ICD-10-CM | POA: Diagnosis not present

## 2021-10-21 ENCOUNTER — Ambulatory Visit: Payer: Medicaid Other | Admitting: Registered Nurse

## 2021-11-11 ENCOUNTER — Ambulatory Visit (INDEPENDENT_AMBULATORY_CARE_PROVIDER_SITE_OTHER): Payer: BC Managed Care – PPO | Admitting: Registered Nurse

## 2021-11-11 ENCOUNTER — Encounter: Payer: Self-pay | Admitting: Registered Nurse

## 2021-11-11 ENCOUNTER — Other Ambulatory Visit: Payer: Self-pay

## 2021-11-11 VITALS — BP 128/82 | HR 88 | Temp 98.0°F | Resp 18 | Ht 65.0 in | Wt 211.0 lb

## 2021-11-11 DIAGNOSIS — E669 Obesity, unspecified: Secondary | ICD-10-CM | POA: Diagnosis not present

## 2021-11-11 LAB — COMPREHENSIVE METABOLIC PANEL
ALT: 10 U/L (ref 0–35)
AST: 15 U/L (ref 0–37)
Albumin: 4.4 g/dL (ref 3.5–5.2)
Alkaline Phosphatase: 56 U/L (ref 39–117)
BUN: 15 mg/dL (ref 6–23)
CO2: 23 mEq/L (ref 19–32)
Calcium: 9.3 mg/dL (ref 8.4–10.5)
Chloride: 106 mEq/L (ref 96–112)
Creatinine, Ser: 1.07 mg/dL (ref 0.40–1.20)
GFR: 67.2 mL/min (ref >60.00)
Glucose, Bld: 91 mg/dL (ref 70–99)
Potassium: 4.2 mEq/L (ref 3.5–5.1)
Sodium: 138 mEq/L (ref 135–145)
Total Bilirubin: 0.4 mg/dL (ref 0.2–1.2)
Total Protein: 7.2 g/dL (ref 6.0–8.3)

## 2021-11-11 LAB — LIPID PANEL
Cholesterol: 190 mg/dL (ref 0–200)
HDL: 41.2 mg/dL (ref >39.00)
NonHDL: 149.29
Total CHOL/HDL Ratio: 5
Triglycerides: 293 mg/dL — ABNORMAL HIGH (ref 0.0–149.0)
VLDL: 58.6 mg/dL — ABNORMAL HIGH (ref 0.0–40.0)

## 2021-11-11 LAB — VITAMIN D 25 HYDROXY (VIT D DEFICIENCY, FRACTURES): VITD: 28.58 ng/mL — ABNORMAL LOW (ref 30.00–100.00)

## 2021-11-11 LAB — CBC WITH DIFFERENTIAL/PLATELET
Basophils Absolute: 0 10*3/uL (ref 0.0–0.1)
Basophils Relative: 0.5 % (ref 0.0–3.0)
Eosinophils Absolute: 0.2 10*3/uL (ref 0.0–0.7)
Eosinophils Relative: 1.7 % (ref 0.0–5.0)
HCT: 40.3 % (ref 36.0–46.0)
Hemoglobin: 13.8 g/dL (ref 12.0–15.0)
Lymphocytes Relative: 28.4 % (ref 12.0–46.0)
Lymphs Abs: 2.7 10*3/uL (ref 0.7–4.0)
MCHC: 34.3 g/dL (ref 30.0–36.0)
MCV: 96.7 fl (ref 78.0–100.0)
Monocytes Absolute: 0.6 10*3/uL (ref 0.1–1.0)
Monocytes Relative: 6.6 % (ref 3.0–12.0)
Neutro Abs: 6 10*3/uL (ref 1.4–7.7)
Neutrophils Relative %: 62.8 % (ref 43.0–77.0)
Platelets: 323 10*3/uL (ref 150.0–400.0)
RBC: 4.17 Mil/uL (ref 3.87–5.11)
RDW: 12.9 % (ref 11.5–15.5)
WBC: 9.6 10*3/uL (ref 4.0–10.5)

## 2021-11-11 LAB — HEMOGLOBIN A1C: Hgb A1c MFr Bld: 5.2 % (ref 4.6–6.5)

## 2021-11-11 LAB — T4, FREE: Free T4: 0.79 ng/dL (ref 0.60–1.60)

## 2021-11-11 LAB — B12 AND FOLATE PANEL
Folate: 9.9 ng/mL (ref >5.9)
Vitamin B-12: 651 pg/mL (ref 211–911)

## 2021-11-11 LAB — TSH: TSH: 1.69 u[IU]/mL (ref 0.35–5.50)

## 2021-11-11 LAB — LDL CHOLESTEROL, DIRECT: Direct LDL: 113 mg/dL

## 2021-11-11 MED ORDER — SEMAGLUTIDE(0.25 OR 0.5MG/DOS) 2 MG/1.5ML ~~LOC~~ SOPN: 0.5000 mg | PEN_INJECTOR | SUBCUTANEOUS | 0 refills | Status: DC

## 2021-11-11 NOTE — Patient Instructions (Addendum)
Ms. Tufte -   Glenetta Hew, MD Jarold Motto, [place] Jacquiline Doe, MD Edwina Barth, MD Letta Moynahan Early, NP Jiles Prows, DNP  Thank you for letting me take part in your care  Rich  If you have lab work done today you will be contacted with your lab results within the next 2 weeks.  If you have not heard from [place] then please contact us. The fastest way to get your results is to register for My Chart.   IF you received an x-ray today, you will receive an invoice from [facility]. Please contact [facility] [place] [facility] at [phone] with questions or concerns regarding your invoice.   IF you received labwork today, you will receive an invoice from [place]. Please contact [facility] at [phone] with questions or concerns regarding your invoice.   Our billing staff will not be able to assist you with questions regarding bills from these companies.  You will be contacted with the lab results as soon as they are available. The fastest way to get your results is to activate your My Chart account. Instructions are located on the last page of this paperwork. If you have not heard from [place] regarding the results in 2 weeks, please contact this office.

## 2021-11-11 NOTE — Progress Notes (Signed)
Established Patient Office Visit  Subjective:  Patient ID: [name], female    DOB: 1985-11-07  Age: 36 y.o. MRN: [id number]  CC:  Chief Complaint  Patient presents with   Follow-up    Patient states she is here for a 6 month follow up for thyroid and also wants to discuss weight     HPI [name] presents for follow up  Thyroid Lab Results  Component Value Date   TSH 1.69 11/11/2021  Synthroid po qd Good effect Notes she continues to gain weight despite euthyroid labs. Has not tried GLP 1 in past Wants to collect lab panel and discuss starting wegovy.  Outpatient Medications Prior to Visit  Medication Sig Dispense Refill   albuterol (VENTOLIN HFA) 108 (90 Base) MCG/ACT inhaler Inhale 2 puffs into the lungs every 6 (six) hours as needed for wheezing or shortness of breath. 8 g 2   cyanocobalamin (,VITAMIN B-12,) 1000 MCG/ML injection Inject 1 mL (1,000 mcg total) into the muscle once a week. 30 mL 1   cyclobenzaprine (FLEXERIL) 5 MG tablet Take 1 tablet (5 mg total) by mouth 3 (three) times daily as needed for muscle spasms. 30 tablet 1   diclofenac (VOLTAREN) 75 MG EC tablet Take 1 tablet (75 mg total) by mouth 2 (two) times daily. 30 tablet 0   diclofenac Sodium (VOLTAREN) 1 % GEL Apply 2 g topically 4 (four) times daily. 100 g 2   hydrOXYzine (ATARAX/VISTARIL) 10 MG tablet Take 0.5-1 tablets (5-10 mg total) by mouth 3 (three) times daily as needed. 30 tablet 0   levothyroxine (SYNTHROID) 50 MCG tablet TAKE 1 TABLET BY MOUTH EVERY DAY 30 tablet 2   No facility-administered medications prior to visit.    Review of Systems Per hpi     Objective:     BP 128/82   Pulse 88   Temp 98 F (36.7 C) (Temporal)   Resp 18   Ht 5\' 5"  (1.651 m)   Wt 211 lb (95.7 kg)   SpO2 97%   BMI 35.11 kg/m   Wt Readings from Last 3 Encounters:  11/11/21 211 lb (95.7 kg)  03/26/21 204 lb 3.2 oz (92.6 kg)  10/22/20 190 lb (86.2 kg)   Physical Exam Vitals and  nursing note reviewed.  Constitutional:      General: She is not in acute distress.    Appearance: Normal appearance. She is normal weight. She is not ill-appearing, toxic-appearing or diaphoretic.  Cardiovascular:     Rate and Rhythm: Normal rate and regular rhythm.     Heart sounds: Normal heart sounds. No murmur heard.    No friction rub. No gallop.  Pulmonary:     Effort: Pulmonary effort is normal. No respiratory distress.     Breath sounds: Normal breath sounds. No stridor. No wheezing, rhonchi or rales.  Chest:     Chest wall: No tenderness.  Skin:    General: Skin is warm and dry.  Neurological:     General: No focal deficit present.     Mental Status: She is alert and oriented to person, place, and time. Mental status is at baseline.  Psychiatric:        Mood and Affect: Mood normal.        Behavior: Behavior normal.        Thought Content: Thought content normal.        Judgment: Judgment normal.     Results for orders placed or performed in  visit on 11/11/21  T4, free  Result Value Ref Range   Free T4 0.79 0.60 - 1.60 ng/dL  CBC with Differential/Platelet  Result Value Ref Range   WBC 9.6 4.0 - 10.5 K/uL   RBC 4.17 3.87 - 5.11 Mil/uL   Hemoglobin 13.8 12.0 - 15.0 g/dL   HCT 96.7 59.1 - 63.8 %   MCV 96.7 78.0 - 100.0 fl   MCHC 34.3 30.0 - 36.0 g/dL   RDW 46.6 59.9 - 35.7 %   Platelets 323.0 150.0 - 400.0 K/uL   Neutrophils Relative % 62.8 43.0 - 77.0 %   Lymphocytes Relative 28.4 12.0 - 46.0 %   Monocytes Relative 6.6 3.0 - 12.0 %   Eosinophils Relative 1.7 0.0 - 5.0 %   Basophils Relative 0.5 0.0 - 3.0 %   Neutro Abs 6.0 1.4 - 7.7 K/uL   Lymphs Abs 2.7 0.7 - 4.0 K/uL   Monocytes Absolute 0.6 0.1 - 1.0 K/uL   Eosinophils Absolute 0.2 0.0 - 0.7 K/uL   Basophils Absolute 0.0 0.0 - 0.1 K/uL  Comprehensive metabolic panel  Result Value Ref Range   Sodium 138 135 - 145 mEq/L   Potassium 4.2 3.5 - 5.1 mEq/L   Chloride 106 96 - 112 mEq/L   CO2 23 19 - 32  mEq/L   Glucose, Bld 91 70 - 99 mg/dL   BUN 15 6 - 23 mg/dL   Creatinine, Ser 0.17 0.40 - 1.20 mg/dL   Total Bilirubin 0.4 0.2 - 1.2 mg/dL   Alkaline Phosphatase 56 39 - 117 U/L   AST 15 0 - 37 U/L   ALT 10 0 - 35 U/L   Total Protein 7.2 6.0 - 8.3 g/dL   Albumin 4.4 3.5 - 5.2 g/dL   GFR 79.39 >03.00 mL/min   Calcium 9.3 8.4 - 10.5 mg/dL  Hemoglobin P2Z  Result Value Ref Range   Hgb A1c MFr Bld 5.2 4.6 - 6.5 %  Lipid panel  Result Value Ref Range   Cholesterol 190 0 - 200 mg/dL   Triglycerides 300.7 (H) 0.0 - 149.0 mg/dL   HDL 62.26 >33.35 mg/dL   VLDL 45.6 (H) 0.0 - 25.6 mg/dL   Total CHOL/HDL Ratio 5    NonHDL 149.29   TSH  Result Value Ref Range   TSH 1.69 0.35 - 5.50 uIU/mL  B12 and Folate Panel  Result Value Ref Range   Vitamin B-12 651 211 - 911 pg/mL   Folate 9.9 >5.9 ng/mL  Vitamin D (25 hydroxy)  Result Value Ref Range   VITD 28.58 (L) 30.00 - 100.00 ng/mL  LDL cholesterol, direct  Result Value Ref Range   Direct LDL 113.0 mg/dL      The ASCVD Risk score (Arnett DK, et al., 2019) failed to calculate for the following reasons:   The 2019 ASCVD risk score is only valid for ages 53 to 35    Assessment & Plan:   Problem List Items Addressed This Visit   None Visit Diagnoses     Obesity (BMI 35.0-39.9 without comorbidity)    -  Primary   Relevant Medications   Semaglutide,0.25 or 0.5MG /DOS, 2 MG/1.5ML SOPN   Other Relevant Orders   T4, free (Completed)   CBC with Differential/Platelet (Completed)   Comprehensive metabolic panel (Completed)   Hemoglobin A1c (Completed)   Lipid panel (Completed)   TSH (Completed)   B12 and Folate Panel (Completed)   Vitamin D (25 hydroxy) (Completed)  Meds ordered this encounter  Medications   Semaglutide,0.25 or 0.5MG /DOS, 2 MG/1.5ML SOPN    Sig: Inject 0.5 mg into the skin once a week.    Dispense:  4.5 mL    Refill:  0    Order Specific Question:   Supervising Provider    Answer:   Neva Seat, JEFFREY R  [[id number]]    Return in about 6 months (around 05/14/2022) for CPE and labs.   PLAN Labs collected. Will follow up with the patient as warranted. Will start wegovy. Reviewed risks in depth with patient who voices understanding Follow up in 3 mo with new pcp Patient encouraged to call clinic with any questions, comments, or concerns.   Janeece Agee, NP

## 2021-12-28 IMAGING — MR MR LUMBAR SPINE WO/W CM
4 of 7 series · 23 of 48 positions shown · IV contrast (16ml Multihance)
Comparison: Radiographs September 26, 2020

CLINICAL DATA: Low back pain.

EXAM:
MRI LUMBAR SPINE WITHOUT AND WITH CONTRAST
TECHNIQUE: Multiplanar and multiecho pulse sequences of the lumbar spine were
obtained without and with intravenous contrast.
CONTRAST:  16mL MULTIHANCE GADOBENATE DIMEGLUMINE 529 MG/ML IV SOLN

[Series 3: T1 · sagittal · 4.0mm · 0.88mm/px · 3 of 14 slices shown (1 of 2)]
[im 1/14]
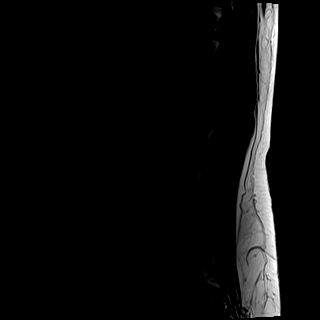
[im 7/14]
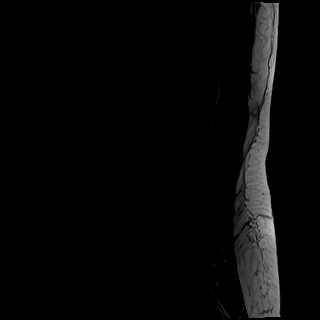
[im 14/14]
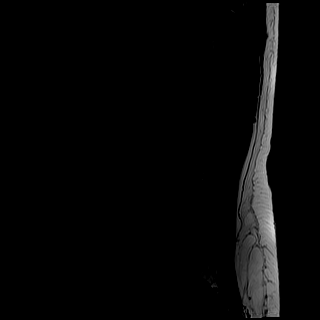

[Series 5: T2 · axial · 4.0mm · 0.39mm/px · z∈[-63,+139]mm · 11 of 38 slices shown (1 of 2)]
[im 1/38]
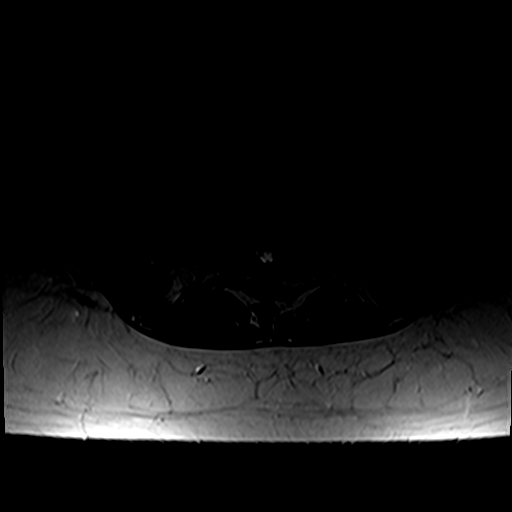
[im 4/38]
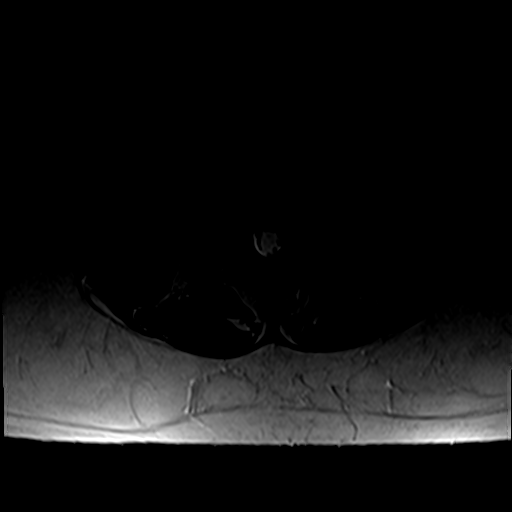
[im 8/38]
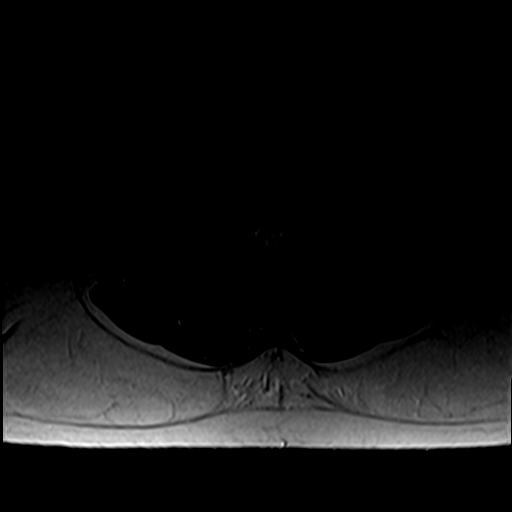
[im 12/38]
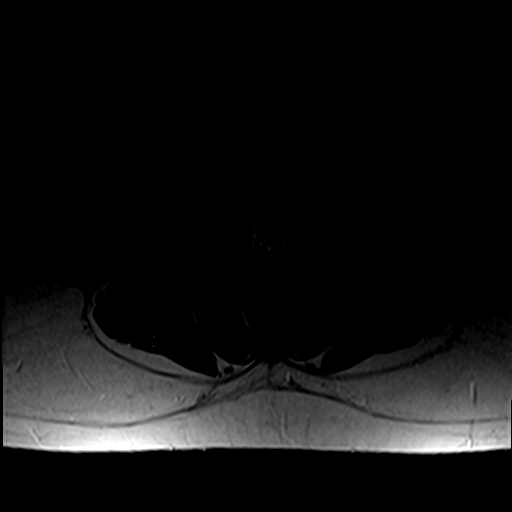
[im 15/38]
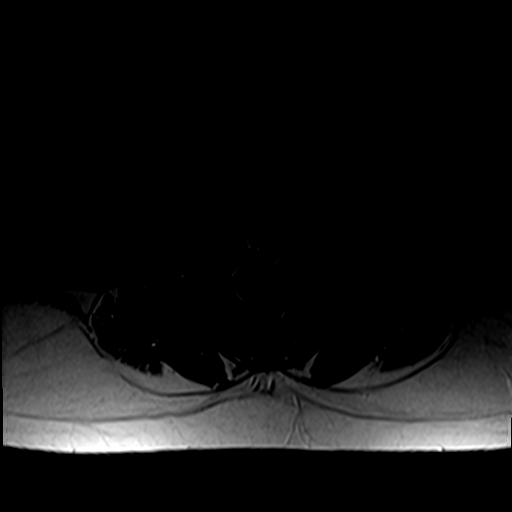
[im 19/38]
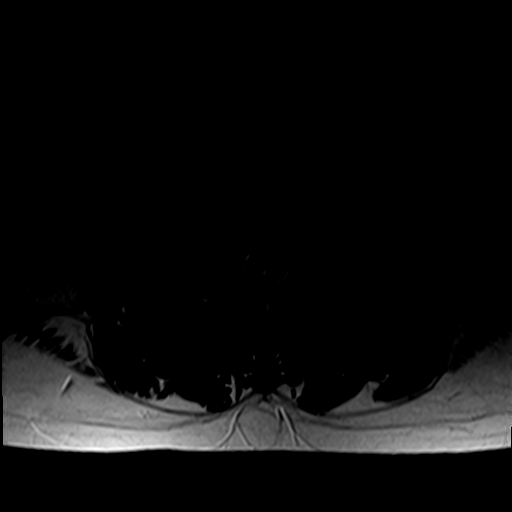
[im 23/38]
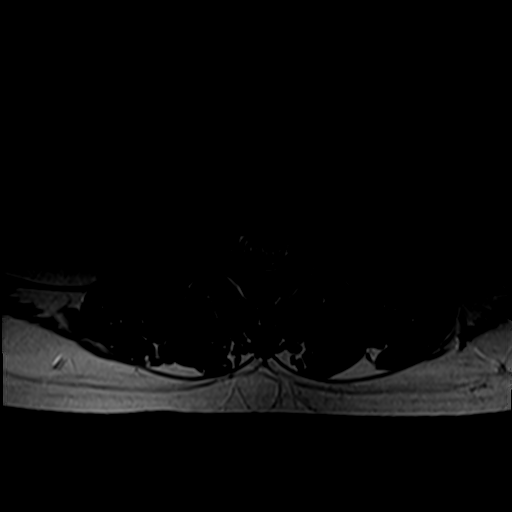
[im 26/38]
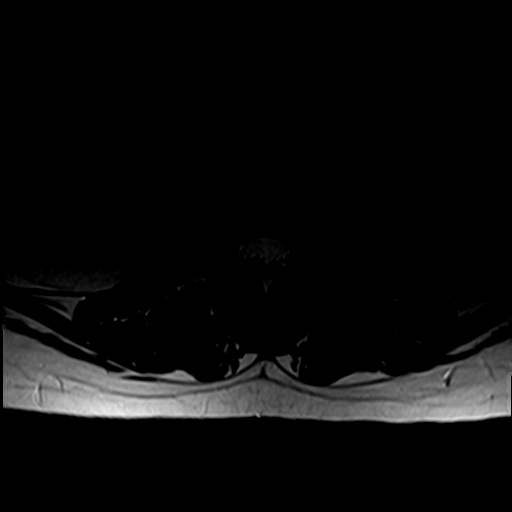
[im 30/38]
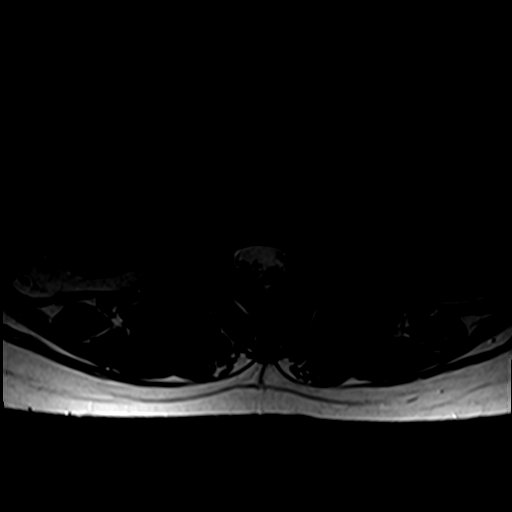
[im 34/38]
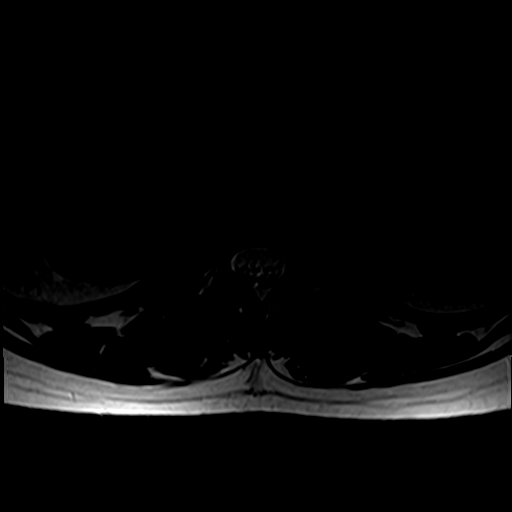
[im 38/38]
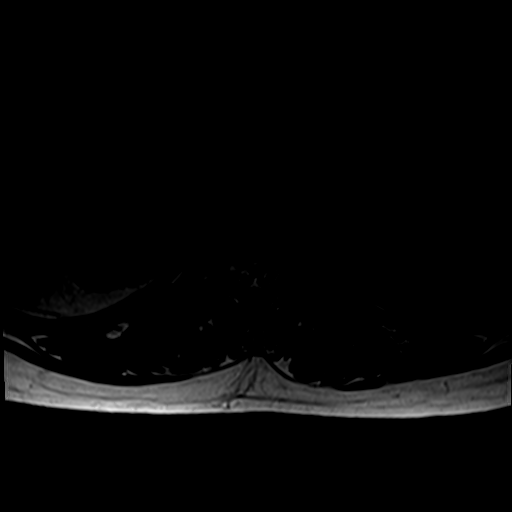

[Series 6: T1 · axial · 4.0mm · 0.39mm/px · z∈[-63,+120]mm · 5 of 38 slices shown (2 of 2)]
[im 1/38]
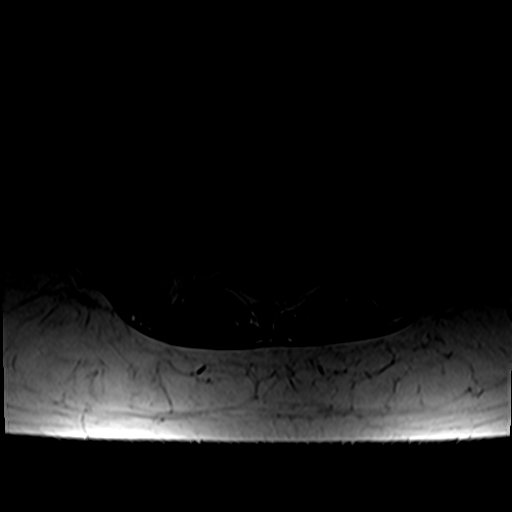
[im 4/38]
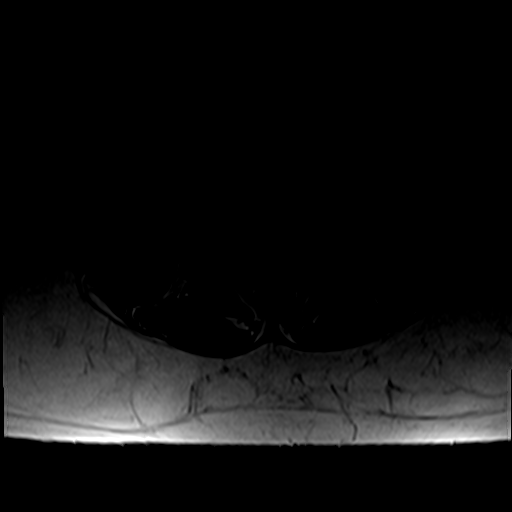
[im 8/38]
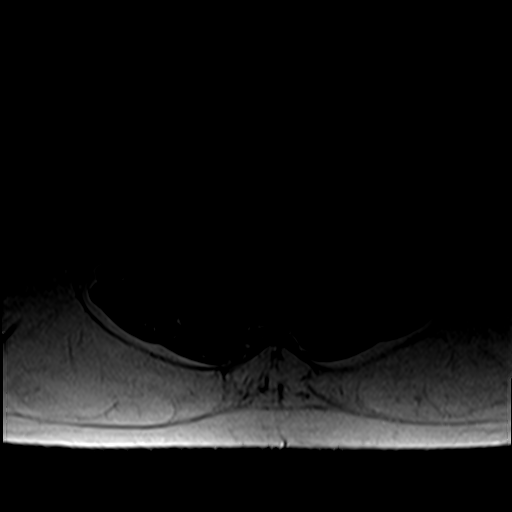
[im 19/38]
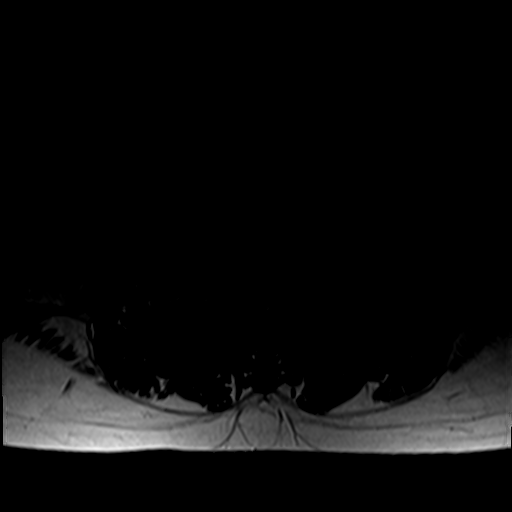
[im 34/38]
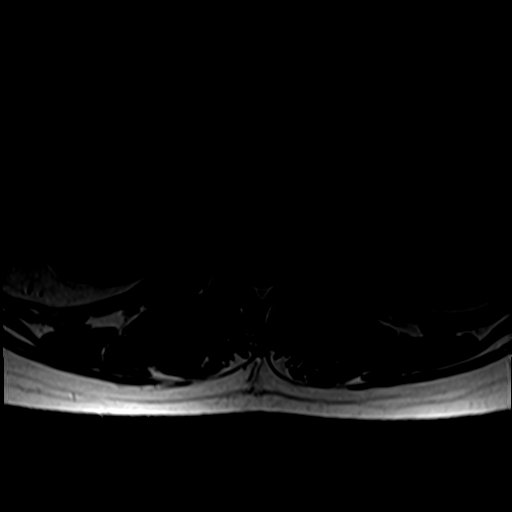

[Series 7: T2 · sagittal · 4.0mm · 1.09mm/px · 4 of 14 slices shown (2 of 2)]
[im 1/14]
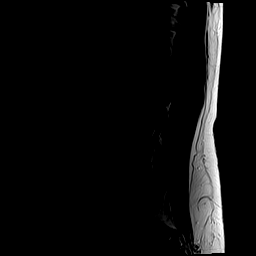
[im 5/14]
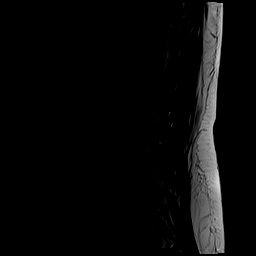
[im 9/14]
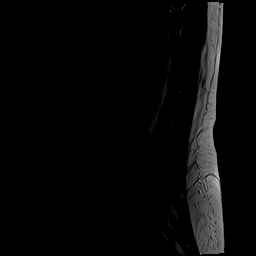
[im 14/14]
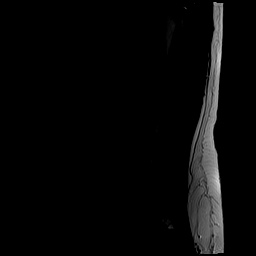

[23 of 48 positions shown; findings below may reference images not displayed]

FINDINGS: Segmentation:  Standard.

Alignment:  Minimal retrolisthesis of L5 over S1.

Vertebrae: No fracture, evidence of discitis, or bone lesion.
Endplate degenerative changes at L5-S1

Conus medullaris and cauda equina: Conus extends to the T12-L1
level. Conus and cauda equina appear normal.

Paraspinal and other soft tissues: Negative.

Disc levels:

T12-L1:No spinal canal or neural foraminal stenosis.

L1-2:No spinal canal or neural foraminal stenosis.

L2-3:No spinal canal or neural foraminal stenosis.

L3-4:No spinal canal or neural foraminal stenosis.

L4-5:Small central disc protrusion with associated annular tear and
mild facet degenerative changes. No significantspinal canal or
neural foraminal stenosis.

L5-S1:Loss of disc height, disc bulge and mild facet degenerative
changes resulting moderate right and mild left neural foraminal
narrowing. No spinal canal stenosis.No spinal canal or neural
foraminal stenosis.
IMPRESSION: 1. Degenerative changes of the lower lumbar spine, more pronounced
at L5-S1 where there is moderate right and mild left neural
foraminal narrowing.
2. No high-grade spinal canal stenosis at any level.

## 2022-01-06 ENCOUNTER — Telehealth: Payer: BC Managed Care – PPO | Admitting: Family Medicine

## 2022-01-06 DIAGNOSIS — J029 Acute pharyngitis, unspecified: Secondary | ICD-10-CM | POA: Diagnosis not present

## 2022-01-06 MED ORDER — AMOXICILLIN 500 MG PO TABS: 500.0000 mg | ORAL_TABLET | Freq: Two times a day (BID) | ORAL | 0 refills | Status: AC

## 2022-01-06 NOTE — Progress Notes (Signed)

## 2022-01-30 ENCOUNTER — Telehealth: Payer: Self-pay

## 2022-01-30 ENCOUNTER — Other Ambulatory Visit: Payer: Self-pay

## 2022-01-30 DIAGNOSIS — E039 Hypothyroidism, unspecified: Secondary | ICD-10-CM

## 2022-01-30 MED ORDER — LEVOTHYROXINE SODIUM 50 MCG PO TABS: 50.0000 ug | ORAL_TABLET | Freq: Every day | ORAL | 3 refills | Status: DC

## 2022-01-30 NOTE — Telephone Encounter (Signed)
Sent in Rx levothyroxine 50 mcg to pharmacy . This is the first refills since [name] has left the office. I did give her 3 refills . I attempted to reach the pt no answer and VM was full so unable to leave a message to inform her that she will need to est care with a new provider before next refill .

## 2022-06-25 DIAGNOSIS — W010XXA Fall on same level from slipping, tripping and stumbling without subsequent striking against object, initial encounter: Secondary | ICD-10-CM | POA: Diagnosis not present

## 2022-06-25 DIAGNOSIS — M25511 Pain in right shoulder: Secondary | ICD-10-CM | POA: Diagnosis not present

## 2022-06-25 DIAGNOSIS — M79621 Pain in right upper arm: Secondary | ICD-10-CM | POA: Diagnosis not present

## 2022-06-25 DIAGNOSIS — S4991XA Unspecified injury of right shoulder and upper arm, initial encounter: Secondary | ICD-10-CM | POA: Diagnosis not present

## 2022-06-26 ENCOUNTER — Ambulatory Visit: Payer: Self-pay

## 2022-06-26 ENCOUNTER — Ambulatory Visit (INDEPENDENT_AMBULATORY_CARE_PROVIDER_SITE_OTHER): Payer: BC Managed Care – PPO | Admitting: Family

## 2022-06-26 DIAGNOSIS — M25511 Pain in right shoulder: Secondary | ICD-10-CM | POA: Diagnosis not present

## 2022-06-26 DIAGNOSIS — M898X1 Other specified disorders of bone, shoulder: Secondary | ICD-10-CM

## 2022-06-27 ENCOUNTER — Ambulatory Visit
Admission: RE | Admit: 2022-06-27 | Discharge: 2022-06-27 | Disposition: A | Discharge status: Home / Self Care | Payer: BC Managed Care – PPO | Source: Ambulatory Visit | Attending: Family | Admitting: Family

## 2022-06-27 DIAGNOSIS — M25511 Pain in right shoulder: Secondary | ICD-10-CM

## 2022-06-30 ENCOUNTER — Encounter: Payer: Self-pay | Admitting: Family

## 2022-06-30 DIAGNOSIS — M25511 Pain in right shoulder: Secondary | ICD-10-CM

## 2022-06-30 DIAGNOSIS — M898X1 Other specified disorders of bone, shoulder: Secondary | ICD-10-CM | POA: Diagnosis not present

## 2022-06-30 MED ORDER — METHYLPREDNISOLONE ACETATE 40 MG/ML IJ SUSP
40.0000 mg | INTRAMUSCULAR | Status: AC | PRN
  Administered 2022-06-30: 40 mg via INTRA_ARTICULAR

## 2022-06-30 MED ORDER — LIDOCAINE HCL 1 % IJ SOLN
5.0000 mL | INTRAMUSCULAR | Status: AC | PRN
  Administered 2022-06-30: 5 mL

## 2022-06-30 NOTE — Progress Notes (Signed)
Office Visit Note   Patient: [name]           Date of Birth: 10/14/1985           MRN: [id number] Visit Date: 06/26/2022              Requested by: Maximiano Coss, NP [place],  [place] [address] PCP: Maximiano Coss, NP  Chief Complaint  Patient presents with   Right Shoulder - Injury      HPI: The patient is a 37 year old woman seen today for evaluation of new injury to right shoulder. She fell yesterday on an outstretched hand, right UE. Had immediate onset of right shoulder pain is unable to raise arm above head difficulty sleeping due to pain unable to get dressed.  Shooting burning pain deep aching sensation in the anterior shoulder no neck pain no weakness in the right upper extremity  [facility]  Initial radiographs were negative.  Having some collarbone pain and tenderness today we will repeat her clavicle XR  Assessment & Plan: Visit Diagnoses:  1. Pain of right clavicle   2. Acute pain of right shoulder     Plan: Radiographs of the right clavicle negative.  Suspect partial tearing of rotator cuff tendon will send for MRI.  Depo-Medrol injection today.  Follow-Up Instructions: No follow-ups on file.   Right Shoulder Exam   Tenderness  The patient is experiencing tenderness in the biceps tendon.  Range of Motion  Active abduction:  90  Passive abduction:  normal  Extension:  30  Forward flexion:  90   Tests  Impingement: positive Drop arm: negative  Other  Erythema: absent Pulse: present      Patient is alert, oriented, no adenopathy, well-dressed, normal affect, normal respiratory effort.   Imaging: No results found. No images are attached to the encounter.  Labs: Lab Results  Component Value Date   HGBA1C 5.2 11/11/2021   HGBA1C 4.9 09/20/2020   HGBA1C 5.5 04/13/2018     Lab Results  Component Value Date   ALBUMIN 4.4 11/11/2021   ALBUMIN 4.6 04/13/2018   ALBUMIN 2.4 (L) 11/14/2012    No  results found for: "MG" Lab Results  Component Value Date   VD25OH 28.58 (L) 11/11/2021   VD25OH 23 (L) 09/20/2020    No results found for: "PREALBUMIN"    Latest Ref Rng & Units 11/11/2021   12:55 PM 03/26/2021    2:04 PM 09/20/2020    3:53 PM  CBC EXTENDED  WBC 4.0 - 10.5 K/uL 9.6  10.0  8.2   RBC 3.87 - 5.11 Mil/uL 4.17  4.31  4.25   Hemoglobin 12.0 - 15.0 g/dL 13.8  13.9  13.8   HCT 36.0 - 46.0 % 40.3  41.1  41.0   Platelets 150.0 - 400.0 K/uL 323.0  355.0  371   NEUT# 1.4 - 7.7 K/uL 6.0   4,346   Lymph# 0.7 - 4.0 K/uL 2.7   3,190      There is no height or weight on file to calculate BMI.  Orders:  Orders Placed This Encounter  Procedures   XR Clavicle Right   MR SHOULDER RIGHT WO CONTRAST   No orders of the defined types were placed in this encounter.    Procedures: Large Joint Inj: R subacromial bursa on 06/30/2022 9:46 AM Indications: pain Details: 22 G 1.5 in needle Medications: 5 mL lidocaine 1 %; 40 mg methylPREDNISolone acetate 40  MG/ML Consent was given by the patient.      Clinical Data: No additional findings.  ROS:  All other systems negative, except as noted in the HPI. Review of Systems  Objective: Vital Signs: There were no vitals taken for this visit.  Specialty Comments:  No specialty comments available.  PMFS History: Patient Active Problem List   Diagnosis Date Noted   Syncope, convulsive 10/02/2020   Tobacco dependence due to cigarettes 04/14/2018   Sciatica, left 04/13/2018   Past Medical History:  Diagnosis Date   Anxiety    Arthritis    Asthma    Back pain    low    Back problem    disc problem cervical, thoracic, lumbar    Depression    Drug abuse (HCC)    Headache(784.0)    Hypothyroidism    Migraine    Sciatic leg pain     Family History  Problem Relation Age of Onset   Diabetes Mother    Arthritis Mother    Arthritis Father    Diabetes Maternal Grandmother    Diabetes Maternal Grandfather    Asthma Son     Seizures Neg Hx     Past Surgical History:  Procedure Laterality Date   CESAREAN SECTION     CESAREAN SECTION N/A 11/16/2012   Procedure: CESAREAN SECTION;  Surgeon: Melina Schools, MD;  Location: [facility];  Service: Obstetrics;  Laterality: N/A;  REPEAT   IUD REMOVAL     WISDOM TOOTH EXTRACTION     WRIST SURGERY     Social History   Occupational History    Employer: [employer]  Tobacco Use   Smoking status: Every Day    Packs/day: 0.50    Types: Cigarettes    Start date: 2012   Smokeless tobacco: Never  Vaping Use   Vaping Use: Former  Substance and Sexual Activity   Alcohol use: Yes    Comment: "on occasion"   Drug use: Yes    Types: Marijuana    Comment: "occasionally"   Sexual activity: Not on file

## 2022-08-30 ENCOUNTER — Other Ambulatory Visit: Payer: Self-pay | Admitting: Family Medicine

## 2022-08-30 DIAGNOSIS — E039 Hypothyroidism, unspecified: Secondary | ICD-10-CM

## 2022-08-31 ENCOUNTER — Other Ambulatory Visit: Payer: Self-pay

## 2022-08-31 ENCOUNTER — Telehealth: Payer: Self-pay

## 2022-08-31 DIAGNOSIS — E039 Hypothyroidism, unspecified: Secondary | ICD-10-CM

## 2022-08-31 MED ORDER — LEVOTHYROXINE SODIUM 50 MCG PO TABS: 50.0000 ug | ORAL_TABLET | Freq: Every day | ORAL | 0 refills | Status: DC

## 2022-08-31 NOTE — Telephone Encounter (Signed)
Attempted to reach pt to inform her that she needs to est care with a new provider for future refill on her Levothyroxine . VM was full unable to leave a message and no answer . A 30 day supply w/ no refills sent to pharmacy with a note attached stating above message .

## 2022-09-13 ENCOUNTER — Other Ambulatory Visit: Payer: Self-pay | Admitting: Family Medicine

## 2022-09-13 DIAGNOSIS — E039 Hypothyroidism, unspecified: Secondary | ICD-10-CM

## 2022-09-14 NOTE — Telephone Encounter (Signed)
I have left the pt a VM stating she will need to est care with a provider . This was sent in on 08/31/22 for 30 day supply with no refills .

## 2022-10-13 DIAGNOSIS — Z3A01 Less than 8 weeks gestation of pregnancy: Secondary | ICD-10-CM | POA: Diagnosis not present

## 2022-10-13 DIAGNOSIS — O3680X1 Pregnancy with inconclusive fetal viability, fetus 1: Secondary | ICD-10-CM | POA: Diagnosis not present

## 2022-10-13 DIAGNOSIS — Z3201 Encounter for pregnancy test, result positive: Secondary | ICD-10-CM | POA: Diagnosis not present

## 2022-10-21 DIAGNOSIS — Z369 Encounter for antenatal screening, unspecified: Secondary | ICD-10-CM | POA: Diagnosis not present

## 2022-10-21 DIAGNOSIS — Z87410 Personal history of cervical dysplasia: Secondary | ICD-10-CM | POA: Diagnosis not present

## 2022-10-21 DIAGNOSIS — O99281 Endocrine, nutritional and metabolic diseases complicating pregnancy, first trimester: Secondary | ICD-10-CM | POA: Diagnosis not present

## 2022-10-21 DIAGNOSIS — E538 Deficiency of other specified B group vitamins: Secondary | ICD-10-CM | POA: Diagnosis not present

## 2022-10-21 DIAGNOSIS — E559 Vitamin D deficiency, unspecified: Secondary | ICD-10-CM | POA: Diagnosis not present

## 2022-10-21 DIAGNOSIS — Z09 Encounter for follow-up examination after completed treatment for conditions other than malignant neoplasm: Secondary | ICD-10-CM | POA: Diagnosis not present

## 2022-10-21 DIAGNOSIS — Z113 Encounter for screening for infections with a predominantly sexual mode of transmission: Secondary | ICD-10-CM | POA: Diagnosis not present

## 2022-10-21 LAB — OB RESULTS CONSOLE GC/CHLAMYDIA
Chlamydia: NEGATIVE
Neisseria Gonorrhea: NEGATIVE

## 2022-10-21 LAB — OB RESULTS CONSOLE HIV ANTIBODY (ROUTINE TESTING): HIV: NONREACTIVE

## 2022-10-21 LAB — OB RESULTS CONSOLE ANTIBODY SCREEN: Antibody Screen: NEGATIVE

## 2022-10-21 LAB — OB RESULTS CONSOLE HEPATITIS B SURFACE ANTIGEN: Hepatitis B Surface Ag: NEGATIVE

## 2022-10-21 LAB — HEPATITIS C ANTIBODY: HCV Ab: NEGATIVE

## 2022-10-21 LAB — OB RESULTS CONSOLE RPR: RPR: NONREACTIVE

## 2022-10-21 LAB — OB RESULTS CONSOLE RUBELLA ANTIBODY, IGM: Rubella: IMMUNE

## 2023-01-11 DIAGNOSIS — O99280 Endocrine, nutritional and metabolic diseases complicating pregnancy, unspecified trimester: Secondary | ICD-10-CM | POA: Diagnosis not present

## 2023-01-11 DIAGNOSIS — O99282 Endocrine, nutritional and metabolic diseases complicating pregnancy, second trimester: Secondary | ICD-10-CM | POA: Diagnosis not present

## 2023-01-11 DIAGNOSIS — O99342 Other mental disorders complicating pregnancy, second trimester: Secondary | ICD-10-CM | POA: Diagnosis not present

## 2023-01-11 DIAGNOSIS — O99212 Obesity complicating pregnancy, second trimester: Secondary | ICD-10-CM | POA: Diagnosis not present

## 2023-01-11 DIAGNOSIS — Z363 Encounter for antenatal screening for malformations: Secondary | ICD-10-CM | POA: Diagnosis not present

## 2023-01-11 DIAGNOSIS — Z3A20 20 weeks gestation of pregnancy: Secondary | ICD-10-CM | POA: Diagnosis not present

## 2023-01-11 DIAGNOSIS — O34211 Maternal care for low transverse scar from previous cesarean delivery: Secondary | ICD-10-CM | POA: Diagnosis not present

## 2023-01-12 ENCOUNTER — Encounter: Payer: Self-pay | Admitting: Orthopedic Surgery

## 2023-01-12 ENCOUNTER — Ambulatory Visit (INDEPENDENT_AMBULATORY_CARE_PROVIDER_SITE_OTHER): Payer: BC Managed Care – PPO | Admitting: Orthopedic Surgery

## 2023-01-12 DIAGNOSIS — M7541 Impingement syndrome of right shoulder: Secondary | ICD-10-CM

## 2023-01-12 DIAGNOSIS — G5601 Carpal tunnel syndrome, right upper limb: Secondary | ICD-10-CM

## 2023-01-12 NOTE — Progress Notes (Signed)
Office Visit Note   Patient: [name]           Date of Birth: 1986-03-09           MRN: [id number] Visit Date: 01/12/2023              Requested by: Janeece Agee, NP [address] [place],  [place] [address] PCP: Janeece Agee, NP  Chief Complaint  Patient presents with   Right Shoulder - Pain      HPI: Patient is a 37 year old woman who presents with impingement symptoms of the right shoulder as well as numbness in the index and long finger that is recent.  Patient is status post an MRI scan is status post a subacromial injection which provided some temporary relief.  Assessment & Plan: Visit Diagnoses:  1. Carpal tunnel syndrome of right wrist   2. Impingement syndrome of right shoulder     Plan: Recommended using the carpal tunnel splint for the right wrist she is given instructions for internal and external rotation strengthening and scapular stabilization.  Follow-up after pregnancy for possible subacromial injection.  Follow-Up Instructions: Return if symptoms worsen or fail to improve.   Ortho Exam  Patient is alert, oriented, no adenopathy, well-dressed, normal affect, normal respiratory effort. Examination patient has numbness in the index and long finger she is pregnant.  She has pain with Neer and Hawkins impingement test of the right shoulder and has tenderness to palpation of the biceps tendon  Review of the MRI scan of the right shoulder shows mild inflammation of the rotator cuff.  Imaging: No results found. No images are attached to the encounter.  Labs: Lab Results  Component Value Date   HGBA1C 5.2 11/11/2021   HGBA1C 4.9 09/20/2020   HGBA1C 5.5 04/13/2018     Lab Results  Component Value Date   ALBUMIN 4.4 11/11/2021   ALBUMIN 4.6 04/13/2018   ALBUMIN 2.4 (L) 11/14/2012    No results found for: "MG" Lab Results  Component Value Date   VD25OH 28.58 (L) 11/11/2021   VD25OH 23 (L) 09/20/2020    No results found for:  "PREALBUMIN"    Latest Ref Rng & Units 11/11/2021   12:55 PM 03/26/2021    2:04 PM 09/20/2020    3:53 PM  CBC EXTENDED  WBC 4.0 - 10.5 K/uL 9.6  10.0  8.2   RBC 3.87 - 5.11 Mil/uL 4.17  4.31  4.25   Hemoglobin 12.0 - 15.0 g/dL 84.1  32.4  40.1   HCT 36.0 - 46.0 % 40.3  41.1  41.0   Platelets 150.0 - 400.0 K/uL 323.0  355.0  371   NEUT# 1.4 - 7.7 K/uL 6.0   4,346   Lymph# 0.7 - 4.0 K/uL 2.7   3,190      There is no height or weight on file to calculate BMI.  Orders:  No orders of the defined types were placed in this encounter.  No orders of the defined types were placed in this encounter.    Procedures: No procedures performed  Clinical Data: No additional findings.  ROS:  All other systems negative, except as noted in the HPI. Review of Systems  Objective: Vital Signs: There were no vitals taken for this visit.  Specialty Comments:  No specialty comments available.  PMFS History: Patient Active Problem List   Diagnosis Date Noted   Syncope, convulsive 10/02/2020   Tobacco dependence due to cigarettes 04/14/2018   Sciatica, left 04/13/2018  Past Medical History:  Diagnosis Date   Anxiety    Arthritis    Asthma    Back pain    low    Back problem    disc problem cervical, thoracic, lumbar    Depression    Drug abuse (HCC)    Headache(784.0)    Hypothyroidism    Migraine    Sciatic leg pain     Family History  Problem Relation Age of Onset   Diabetes Mother    Arthritis Mother    Arthritis Father    Diabetes Maternal Grandmother    Diabetes Maternal Grandfather    Asthma Son    Seizures Neg Hx     Past Surgical History:  Procedure Laterality Date   CESAREAN SECTION     CESAREAN SECTION N/A 11/16/2012   Procedure: CESAREAN SECTION;  Surgeon: Bing Plume, MD;  Location: [facility];  Service: Obstetrics;  Laterality: N/A;  REPEAT   IUD REMOVAL     WISDOM TOOTH EXTRACTION     WRIST SURGERY     Social History   Occupational History     Employer: [employer]  Tobacco Use   Smoking status: Every Day    Current packs/day: 0.50    Average packs/day: 0.5 packs/day for 12.7 years (6.4 ttl pk-yrs)    Types: Cigarettes    Start date: 2012   Smokeless tobacco: Never  Vaping Use   Vaping status: Former  Substance and Sexual Activity   Alcohol use: Yes    Comment: "on occasion"   Drug use: Yes    Types: Marijuana    Comment: "occasionally"   Sexual activity: Not on file

## 2023-01-13 ENCOUNTER — Ambulatory Visit: Payer: BC Managed Care – PPO | Admitting: Family

## 2023-02-23 DIAGNOSIS — Z3A26 26 weeks gestation of pregnancy: Secondary | ICD-10-CM | POA: Diagnosis not present

## 2023-02-23 DIAGNOSIS — M9903 Segmental and somatic dysfunction of lumbar region: Secondary | ICD-10-CM | POA: Diagnosis not present

## 2023-02-23 DIAGNOSIS — M62838 Other muscle spasm: Secondary | ICD-10-CM | POA: Diagnosis not present

## 2023-03-11 DIAGNOSIS — O09523 Supervision of elderly multigravida, third trimester: Secondary | ICD-10-CM | POA: Diagnosis not present

## 2023-03-11 DIAGNOSIS — E538 Deficiency of other specified B group vitamins: Secondary | ICD-10-CM | POA: Diagnosis not present

## 2023-03-11 DIAGNOSIS — Z3689 Encounter for other specified antenatal screening: Secondary | ICD-10-CM | POA: Diagnosis not present

## 2023-03-11 DIAGNOSIS — Z3A28 28 weeks gestation of pregnancy: Secondary | ICD-10-CM | POA: Diagnosis not present

## 2023-03-11 DIAGNOSIS — E039 Hypothyroidism, unspecified: Secondary | ICD-10-CM | POA: Diagnosis not present

## 2023-03-11 DIAGNOSIS — Z23 Encounter for immunization: Secondary | ICD-10-CM | POA: Diagnosis not present

## 2023-03-16 DIAGNOSIS — O99810 Abnormal glucose complicating pregnancy: Secondary | ICD-10-CM | POA: Diagnosis not present

## 2023-04-07 ENCOUNTER — Encounter: Payer: BC Managed Care – PPO | Attending: Obstetrics and Gynecology | Admitting: Dietician

## 2023-04-07 DIAGNOSIS — O24415 Gestational diabetes mellitus in pregnancy, controlled by oral hypoglycemic drugs: Secondary | ICD-10-CM | POA: Diagnosis not present

## 2023-04-07 NOTE — Progress Notes (Signed)
Patient was seen on 04/07/2023 for Gestational Diabetes self-management class at the [facility]. The following learning objectives were met by the patient during this course:  States the definition of Gestational Diabetes States why dietary management is important in controlling blood glucose Describes the effects each nutrient has on blood glucose levels Demonstrates ability to create a balanced meal plan Demonstrates carbohydrate counting  States when to check blood glucose levels Meter instruction States the effect of stress and exercise on blood glucose levels States the importance of limiting caffeine and abstaining from alcohol and smoking  Blood glucose monitor: Pt presents with smart phone app and states meter at home Fasting: 99-115 mg/dL  1 hour post prandial: 106, 150, 133, 166 mg/dL    Patient instructed to monitor glucose levels: QID FBS: 60 - <90 1 hour: <140 2 hour: <120  *Patient received handouts: Nutrition Diabetes and Pregnancy Carbohydrate Counting List Blood glucose log Snack ideas for diabetes during pregnancy  Patient will be seen for follow-up as needed.

## 2023-04-08 DIAGNOSIS — O24415 Gestational diabetes mellitus in pregnancy, controlled by oral hypoglycemic drugs: Secondary | ICD-10-CM | POA: Diagnosis not present

## 2023-04-08 DIAGNOSIS — O99280 Endocrine, nutritional and metabolic diseases complicating pregnancy, unspecified trimester: Secondary | ICD-10-CM | POA: Diagnosis not present

## 2023-04-08 DIAGNOSIS — Z3A32 32 weeks gestation of pregnancy: Secondary | ICD-10-CM | POA: Diagnosis not present

## 2023-04-08 DIAGNOSIS — O24419 Gestational diabetes mellitus in pregnancy, unspecified control: Secondary | ICD-10-CM | POA: Diagnosis not present

## 2023-04-08 DIAGNOSIS — O99213 Obesity complicating pregnancy, third trimester: Secondary | ICD-10-CM | POA: Diagnosis not present

## 2023-04-14 DIAGNOSIS — O09523 Supervision of elderly multigravida, third trimester: Secondary | ICD-10-CM | POA: Diagnosis not present

## 2023-04-14 DIAGNOSIS — O24419 Gestational diabetes mellitus in pregnancy, unspecified control: Secondary | ICD-10-CM | POA: Diagnosis not present

## 2023-04-14 DIAGNOSIS — O99280 Endocrine, nutritional and metabolic diseases complicating pregnancy, unspecified trimester: Secondary | ICD-10-CM | POA: Diagnosis not present

## 2023-04-14 DIAGNOSIS — Z3A33 33 weeks gestation of pregnancy: Secondary | ICD-10-CM | POA: Diagnosis not present

## 2023-04-23 DIAGNOSIS — O24415 Gestational diabetes mellitus in pregnancy, controlled by oral hypoglycemic drugs: Secondary | ICD-10-CM | POA: Diagnosis not present

## 2023-04-23 DIAGNOSIS — O34211 Maternal care for low transverse scar from previous cesarean delivery: Secondary | ICD-10-CM | POA: Diagnosis not present

## 2023-04-23 DIAGNOSIS — O99213 Obesity complicating pregnancy, third trimester: Secondary | ICD-10-CM | POA: Diagnosis not present

## 2023-04-23 DIAGNOSIS — Z3A35 35 weeks gestation of pregnancy: Secondary | ICD-10-CM | POA: Diagnosis not present

## 2023-04-23 DIAGNOSIS — Z3A34 34 weeks gestation of pregnancy: Secondary | ICD-10-CM | POA: Diagnosis not present

## 2023-04-23 DIAGNOSIS — O99343 Other mental disorders complicating pregnancy, third trimester: Secondary | ICD-10-CM | POA: Diagnosis not present

## 2023-04-23 DIAGNOSIS — O24419 Gestational diabetes mellitus in pregnancy, unspecified control: Secondary | ICD-10-CM | POA: Diagnosis not present

## 2023-04-30 DIAGNOSIS — Z3A35 35 weeks gestation of pregnancy: Secondary | ICD-10-CM | POA: Diagnosis not present

## 2023-04-30 DIAGNOSIS — O34211 Maternal care for low transverse scar from previous cesarean delivery: Secondary | ICD-10-CM | POA: Diagnosis not present

## 2023-04-30 DIAGNOSIS — O24415 Gestational diabetes mellitus in pregnancy, controlled by oral hypoglycemic drugs: Secondary | ICD-10-CM | POA: Diagnosis not present

## 2023-05-05 DIAGNOSIS — O24415 Gestational diabetes mellitus in pregnancy, controlled by oral hypoglycemic drugs: Secondary | ICD-10-CM | POA: Diagnosis not present

## 2023-05-05 DIAGNOSIS — O139 Gestational [pregnancy-induced] hypertension without significant proteinuria, unspecified trimester: Secondary | ICD-10-CM | POA: Diagnosis not present

## 2023-05-05 DIAGNOSIS — O34211 Maternal care for low transverse scar from previous cesarean delivery: Secondary | ICD-10-CM | POA: Diagnosis not present

## 2023-05-05 DIAGNOSIS — Z3685 Encounter for antenatal screening for Streptococcus B: Secondary | ICD-10-CM | POA: Diagnosis not present

## 2023-05-05 DIAGNOSIS — Z3A36 36 weeks gestation of pregnancy: Secondary | ICD-10-CM | POA: Diagnosis not present

## 2023-05-06 DIAGNOSIS — Z3A36 36 weeks gestation of pregnancy: Secondary | ICD-10-CM | POA: Diagnosis not present

## 2023-05-06 DIAGNOSIS — O24419 Gestational diabetes mellitus in pregnancy, unspecified control: Secondary | ICD-10-CM | POA: Diagnosis not present

## 2023-05-06 DIAGNOSIS — O99280 Endocrine, nutritional and metabolic diseases complicating pregnancy, unspecified trimester: Secondary | ICD-10-CM | POA: Diagnosis not present

## 2023-05-06 DIAGNOSIS — O368199 Decreased fetal movements, unspecified trimester, other fetus: Secondary | ICD-10-CM | POA: Diagnosis not present

## 2023-05-07 ENCOUNTER — Encounter (HOSPITAL_COMMUNITY): Payer: Self-pay | Admitting: *Deleted

## 2023-05-07 ENCOUNTER — Encounter (HOSPITAL_COMMUNITY): Payer: Self-pay

## 2023-05-07 NOTE — Patient Instructions (Addendum)
 [name]  05/07/2023   Your procedure is scheduled on:  05/10/2023  Arrive at 1215 at [facility] on [address] at [facility]  and [address]. You are invited to use the FREE valet parking or use the Visitor's parking deck.  Pick up the phone at the desk and dial [phone].  Call this number if you have problems the morning of surgery: [phone]  Remember:   Do not eat food:(After Midnight) Desps de medianoche.  You may drink clear liquids until arrival at __1:15 PM___.  Clear liquids means a liquid you can see thru.  It can have color such as Cola or Kool aid.  Tea is OK and coffee as long as no milk or creamer of any kind.  Take these medicines the morning of surgery with A SIP OF WATER :   bring your inhaler with you   Do not wear jewelry, make-up or nail polish.  Do not wear lotions, powders, or perfumes. Do not wear deodorant.  Do not shave 48 hours prior to surgery.  Do not bring valuables to the hospital.  [facility] is not   responsible for any belongings or valuables brought to the hospital.  Contacts, dentures or bridgework may not be worn into surgery.  Leave suitcase in the car. After surgery it may be brought to your room.  For patients admitted to the hospital, checkout time is 11:00 AM the day of              discharge.      Please read over the following fact sheets that you were given:     Preparing for Surgery

## 2023-05-07 NOTE — H&P (Signed)
 [name] is a 38 y.o. female [id number] presenting for scheduled repeat cesarean section.  Pregnancy is complicated by:  - GHTN: diagnosed at 36 weeks - GDMA2: No logs available for review, reports BG well controlled. Growth US  1/8: EFW 3677 (93%), AC >99%, AFI 8 - Maternal obesity - Previous c-section x 2 - Hx hypothyroidism: No meds, TSH 11/14 2.3 - Anxiety: Vistaril  PRN  +Fms. Denies leakage of fluid, vaginal bleeding, or contractions.   OB History     Gravida  3   Para  2   Term  2   Preterm      AB      Living  2      SAB      IAB      Ectopic      Multiple      Live Births  2          Past Medical History:  Diagnosis Date   Anxiety    Arthritis    Asthma    Back pain    low    Back problem    disc problem cervical, thoracic, lumbar    Depression    Drug abuse (HCC)    Headache(784.0)    Hypothyroidism    Migraine    Sciatic leg pain    Past Surgical History:  Procedure Laterality Date   CESAREAN SECTION     CESAREAN SECTION N/A 11/16/2012   Procedure: CESAREAN SECTION;  Surgeon: Debby JULIANNA Lares, MD;  Location: [facility];  Service: Obstetrics;  Laterality: N/A;  REPEAT   IUD REMOVAL     WISDOM TOOTH EXTRACTION     WRIST SURGERY     Family History: family history includes Arthritis in her father and mother; Asthma in her son; Diabetes in her maternal grandfather, maternal grandmother, and mother. Social History:  reports that she has been smoking cigarettes. She started smoking about 13 years ago. She has a 6.5 pack-year smoking history. She has never used smokeless tobacco. She reports current alcohol use. She reports current drug use. Drug: Marijuana.     Maternal Diabetes: Yes:  Diabetes Type:  Insulin/Medication controlled Genetic Screening: Normal Maternal Ultrasounds/Referrals: Other: suspected macrosomia Fetal Ultrasounds or other Referrals:  None Maternal Substance Abuse:  No Significant Maternal Medications:  Meds include:  Other: Metformin Significant Maternal Lab Results:  Group B Strep negative Number of Prenatal Visits:greater than 3 verified prenatal visits Maternal Vaccinations:TDap Other Comments:  None  Review of Systems  Constitutional:  Negative for chills and fever.  Respiratory:  Negative for chest tightness and shortness of breath.   Cardiovascular:  Negative for chest pain.  Gastrointestinal:  Negative for abdominal pain and constipation.  Genitourinary:  Positive for pelvic pain. Negative for vaginal bleeding and vaginal discharge.  Neurological:  Negative for light-headedness and headaches.   History   Height 5' 3 (1.6 m), weight 110.2 kg. Exam Physical Exam Constitutional:      General: She is not in acute distress.    Appearance: She is obese.  HENT:     Head: Normocephalic and atraumatic.  Pulmonary:     Effort: Pulmonary effort is normal.  Musculoskeletal:        General: Normal range of motion.  Skin:    General: Skin is warm and dry.  Neurological:     Mental Status: She is alert.  Psychiatric:        Mood and Affect: Mood normal.        Behavior:  Behavior normal.     Prenatal labs: ABO, Rh:   Antibody: Negative (06/26 0000) Rubella: Immune (06/26 0000) RPR: Nonreactive (06/26 0000)  HBsAg: Negative (06/26 0000)  HIV: Non-reactive (06/26 0000)  GBS:   neg  Assessment/Plan: 38 yo G3P2002 presents for RLTCS with bilateral tubal ligation and vulvar skin tag removal - Ancef  2g  - Has very remote history of opioid use disorder, none in pregnancy. Desires limited use of narcotic post-operatively. Will discuss possible TAP block with anesthesia  -PreE labs -To OR when ready  [name] 05/07/2023, 5:21 PM

## 2023-05-10 ENCOUNTER — Encounter (HOSPITAL_COMMUNITY): Payer: Self-pay | Admitting: Student

## 2023-05-10 ENCOUNTER — Encounter (HOSPITAL_COMMUNITY)
Admission: RE | Disposition: A | Discharge status: Home / Self Care | Payer: Self-pay | Source: Home / Self Care | Attending: Student

## 2023-05-10 ENCOUNTER — Other Ambulatory Visit: Payer: Self-pay

## 2023-05-10 ENCOUNTER — Inpatient Hospital Stay (HOSPITAL_COMMUNITY): Payer: BC Managed Care – PPO | Admitting: Anesthesiology

## 2023-05-10 ENCOUNTER — Inpatient Hospital Stay (HOSPITAL_COMMUNITY)
Admission: RE | Admit: 2023-05-10 | Discharge: 2023-05-12 | DRG: 785 | Disposition: A | Discharge status: Home / Self Care | Payer: BC Managed Care – PPO | Attending: Student | Admitting: Student

## 2023-05-10 DIAGNOSIS — O134 Gestational [pregnancy-induced] hypertension without significant proteinuria, complicating childbirth: Secondary | ICD-10-CM | POA: Diagnosis present

## 2023-05-10 DIAGNOSIS — O34211 Maternal care for low transverse scar from previous cesarean delivery: Principal | ICD-10-CM | POA: Diagnosis present

## 2023-05-10 DIAGNOSIS — O99334 Smoking (tobacco) complicating childbirth: Secondary | ICD-10-CM | POA: Diagnosis present

## 2023-05-10 DIAGNOSIS — O133 Gestational [pregnancy-induced] hypertension without significant proteinuria, third trimester: Secondary | ICD-10-CM

## 2023-05-10 DIAGNOSIS — Z98891 History of uterine scar from previous surgery: Secondary | ICD-10-CM | POA: Diagnosis not present

## 2023-05-10 DIAGNOSIS — N9089 Other specified noninflammatory disorders of vulva and perineum: Secondary | ICD-10-CM | POA: Diagnosis not present

## 2023-05-10 DIAGNOSIS — O24425 Gestational diabetes mellitus in childbirth, controlled by oral hypoglycemic drugs: Secondary | ICD-10-CM | POA: Diagnosis not present

## 2023-05-10 DIAGNOSIS — O99214 Obesity complicating childbirth: Secondary | ICD-10-CM | POA: Diagnosis not present

## 2023-05-10 DIAGNOSIS — E66813 Obesity, class 3: Secondary | ICD-10-CM | POA: Diagnosis present

## 2023-05-10 DIAGNOSIS — L918 Other hypertrophic disorders of the skin: Secondary | ICD-10-CM | POA: Diagnosis not present

## 2023-05-10 DIAGNOSIS — F17210 Nicotine dependence, cigarettes, uncomplicated: Secondary | ICD-10-CM | POA: Diagnosis not present

## 2023-05-10 DIAGNOSIS — Z302 Encounter for sterilization: Secondary | ICD-10-CM

## 2023-05-10 DIAGNOSIS — Z3A37 37 weeks gestation of pregnancy: Secondary | ICD-10-CM

## 2023-05-10 DIAGNOSIS — O3429 Maternal care due to uterine scar from other previous surgery: Secondary | ICD-10-CM | POA: Diagnosis present

## 2023-05-10 DIAGNOSIS — F419 Anxiety disorder, unspecified: Secondary | ICD-10-CM | POA: Diagnosis present

## 2023-05-10 DIAGNOSIS — O99344 Other mental disorders complicating childbirth: Secondary | ICD-10-CM | POA: Diagnosis present

## 2023-05-10 DIAGNOSIS — O34219 Maternal care for unspecified type scar from previous cesarean delivery: Principal | ICD-10-CM

## 2023-05-10 HISTORY — PX: EXCISION OF SKIN TAG: SHX6270

## 2023-05-10 LAB — CBC WITH DIFFERENTIAL/PLATELET
Abs Immature Granulocytes: 0.03 10*3/uL (ref 0.00–0.07)
Basophils Absolute: 0 10*3/uL (ref 0.0–0.1)
Basophils Relative: 0 %
Eosinophils Absolute: 0.1 10*3/uL (ref 0.0–0.5)
Eosinophils Relative: 1 %
HCT: 35.3 % — ABNORMAL LOW (ref 36.0–46.0)
Hemoglobin: 12.5 g/dL (ref 12.0–15.0)
Immature Granulocytes: 0 %
Lymphocytes Relative: 28 %
Lymphs Abs: 2.3 10*3/uL (ref 0.7–4.0)
MCH: 31.5 pg (ref 26.0–34.0)
MCHC: 35.4 g/dL (ref 30.0–36.0)
MCV: 88.9 fL (ref 80.0–100.0)
Monocytes Absolute: 0.5 10*3/uL (ref 0.1–1.0)
Monocytes Relative: 6 %
Neutro Abs: 5.2 10*3/uL (ref 1.7–7.7)
Neutrophils Relative %: 65 %
Platelets: 296 10*3/uL (ref 150–400)
RBC: 3.97 MIL/uL (ref 3.87–5.11)
RDW: 12.7 % (ref 11.5–15.5)
WBC: 8.1 10*3/uL (ref 4.0–10.5)
nRBC: 0 % (ref 0.0–0.2)

## 2023-05-10 LAB — COMPREHENSIVE METABOLIC PANEL
ALT: 13 U/L (ref 0–44)
AST: 18 U/L (ref 15–41)
Albumin: 2.9 g/dL — ABNORMAL LOW (ref 3.5–5.0)
Alkaline Phosphatase: 150 U/L — ABNORMAL HIGH (ref 38–126)
Anion gap: 10 (ref 5–15)
BUN: 7 mg/dL (ref 6–20)
CO2: 18 mmol/L — ABNORMAL LOW (ref 22–32)
Calcium: 8.9 mg/dL (ref 8.9–10.3)
Chloride: 106 mmol/L (ref 98–111)
Creatinine, Ser: 0.76 mg/dL (ref 0.44–1.00)
GFR, Estimated: >60 mL/min (ref >60)
Glucose, Bld: 77 mg/dL (ref 70–99)
Potassium: 3.8 mmol/L (ref 3.5–5.1)
Sodium: 134 mmol/L — ABNORMAL LOW (ref 135–145)
Total Bilirubin: 0.8 mg/dL (ref 0.0–1.2)
Total Protein: 6.5 g/dL (ref 6.5–8.1)

## 2023-05-10 LAB — GLUCOSE, CAPILLARY
Glucose-Capillary: 75 mg/dL (ref 70–99)
Glucose-Capillary: 88 mg/dL (ref 70–99)

## 2023-05-10 LAB — TYPE AND SCREEN
ABO/RH(D): B POS
Antibody Screen: NEGATIVE

## 2023-05-10 LAB — PROTEIN / CREATININE RATIO, URINE
Creatinine, Urine: 51 mg/dL
Total Protein, Urine: <6 mg/dL

## 2023-05-10 SURGERY — Surgical Case
Anesthesia: Spinal | Site: Abdomen | Laterality: Bilateral

## 2023-05-10 MED ORDER — SODIUM CHLORIDE 0.9 % IR SOLN
Status: DC | PRN
  Administered 2023-05-10: 1

## 2023-05-10 MED ORDER — KETOROLAC TROMETHAMINE 30 MG/ML IJ SOLN
INTRAMUSCULAR | Status: AC
Start: 2023-05-10 — End: ?
  Filled 2023-05-10: qty 1

## 2023-05-10 MED ORDER — FENTANYL CITRATE (PF) 100 MCG/2ML IJ SOLN: 25.0000 ug | INTRAMUSCULAR | Status: DC | PRN

## 2023-05-10 MED ORDER — FENTANYL CITRATE (PF) 100 MCG/2ML IJ SOLN
INTRAMUSCULAR | Status: DC | PRN
  Administered 2023-05-10: 15 ug via INTRATHECAL

## 2023-05-10 MED ORDER — CEFAZOLIN SODIUM-DEXTROSE 2-4 GM/100ML-% IV SOLN
INTRAVENOUS | Status: AC
  Filled 2023-05-10: qty 100

## 2023-05-10 MED ORDER — SCOPOLAMINE 1 MG/3DAYS TD PT72: 1.0000 | MEDICATED_PATCH | Freq: Once | TRANSDERMAL | Status: DC

## 2023-05-10 MED ORDER — SCOPOLAMINE 1 MG/3DAYS TD PT72
1.0000 | MEDICATED_PATCH | Freq: Once | TRANSDERMAL | Status: DC
  Administered 2023-05-10: 1.5 mg via TRANSDERMAL

## 2023-05-10 MED ORDER — METHOCARBAMOL 500 MG PO TABS
500.0000 mg | ORAL_TABLET | Freq: Three times a day (TID) | ORAL | Status: DC
  Administered 2023-05-10 – 2023-05-12 (×4): 500 mg via ORAL
  Filled 2023-05-10 (×8): qty 1

## 2023-05-10 MED ORDER — ONDANSETRON HCL 4 MG/2ML IJ SOLN
INTRAMUSCULAR | Status: AC
  Filled 2023-05-10: qty 2

## 2023-05-10 MED ORDER — MENTHOL 3 MG MT LOZG
1.0000 | LOZENGE | OROMUCOSAL | Status: DC | PRN
Start: 2023-05-10 — End: 2023-05-12

## 2023-05-10 MED ORDER — FAMOTIDINE 20 MG PO TABS: 20.0000 mg | ORAL_TABLET | Freq: Once | ORAL | Status: DC

## 2023-05-10 MED ORDER — ACETAMINOPHEN 10 MG/ML IV SOLN
INTRAVENOUS | Status: DC | PRN
  Administered 2023-05-10: 1000 mg via INTRAVENOUS

## 2023-05-10 MED ORDER — NALOXONE HCL 0.4 MG/ML IJ SOLN: 0.4000 mg | INTRAMUSCULAR | Status: DC | PRN

## 2023-05-10 MED ORDER — NALOXONE HCL 4 MG/10ML IJ SOLN: 1.0000 ug/kg/h | INTRAVENOUS | Status: DC | PRN

## 2023-05-10 MED ORDER — AMISULPRIDE (ANTIEMETIC) 5 MG/2ML IV SOLN: 10.0000 mg | Freq: Once | INTRAVENOUS | Status: DC | PRN

## 2023-05-10 MED ORDER — ACETAMINOPHEN 500 MG PO TABS
1000.0000 mg | ORAL_TABLET | Freq: Four times a day (QID) | ORAL | Status: DC
  Administered 2023-05-10 – 2023-05-12 (×7): 1000 mg via ORAL
  Filled 2023-05-10 (×7): qty 2

## 2023-05-10 MED ORDER — PHENYLEPHRINE HCL-NACL 20-0.9 MG/250ML-% IV SOLN
INTRAVENOUS | Status: AC
  Filled 2023-05-10: qty 250

## 2023-05-10 MED ORDER — LACTATED RINGERS IV SOLN: INTRAVENOUS | Status: DC

## 2023-05-10 MED ORDER — LACTATED RINGERS IV SOLN
INTRAVENOUS | Status: DC
  Administered 2023-05-10: 40 mL/h via INTRAVENOUS

## 2023-05-10 MED ORDER — EPHEDRINE SULFATE-NACL 50-0.9 MG/10ML-% IV SOSY
PREFILLED_SYRINGE | INTRAVENOUS | Status: DC | PRN
  Administered 2023-05-10: 10 mg via INTRAVENOUS

## 2023-05-10 MED ORDER — OXYTOCIN-SODIUM CHLORIDE 30-0.9 UT/500ML-% IV SOLN
2.5000 [IU]/h | INTRAVENOUS | Status: AC
  Administered 2023-05-10: 2.5 [IU]/h via INTRAVENOUS
  Filled 2023-05-10: qty 500

## 2023-05-10 MED ORDER — DIPHENHYDRAMINE HCL 25 MG PO CAPS
25.0000 mg | ORAL_CAPSULE | ORAL | Status: DC | PRN
Start: 2023-05-10 — End: 2023-05-12

## 2023-05-10 MED ORDER — MORPHINE SULFATE (PF) 0.5 MG/ML IJ SOLN
INTRAMUSCULAR | Status: AC
  Filled 2023-05-10: qty 10

## 2023-05-10 MED ORDER — SODIUM CHLORIDE 0.9% FLUSH: 3.0000 mL | INTRAVENOUS | Status: DC | PRN

## 2023-05-10 MED ORDER — PRENATAL MULTIVITAMIN CH
1.0000 | ORAL_TABLET | Freq: Every day | ORAL | Status: DC
  Administered 2023-05-11 – 2023-05-12 (×2): 1 via ORAL
  Filled 2023-05-10 (×2): qty 1

## 2023-05-10 MED ORDER — SILVER NITRATE-POT NITRATE 75-25 % EX MISC
CUTANEOUS | Status: DC | PRN
  Administered 2023-05-10: 1

## 2023-05-10 MED ORDER — SIMETHICONE 80 MG PO CHEW
80.0000 mg | CHEWABLE_TABLET | Freq: Three times a day (TID) | ORAL | Status: DC
  Administered 2023-05-11 – 2023-05-12 (×4): 80 mg via ORAL
  Filled 2023-05-10 (×4): qty 1

## 2023-05-10 MED ORDER — LEVOTHYROXINE SODIUM 50 MCG PO TABS
50.0000 ug | ORAL_TABLET | Freq: Every day | ORAL | Status: DC
  Administered 2023-05-11 – 2023-05-12 (×2): 50 ug via ORAL
  Filled 2023-05-10 (×2): qty 1

## 2023-05-10 MED ORDER — SILVER NITRATE-POT NITRATE 75-25 % EX MISC
CUTANEOUS | Status: AC
Start: 2023-05-10 — End: ?
  Filled 2023-05-10: qty 10

## 2023-05-10 MED ORDER — WITCH HAZEL-GLYCERIN EX PADS: 1.0000 | MEDICATED_PAD | CUTANEOUS | Status: DC | PRN

## 2023-05-10 MED ORDER — TRANEXAMIC ACID-NACL 1000-0.7 MG/100ML-% IV SOLN
INTRAVENOUS | Status: AC
  Filled 2023-05-10: qty 100

## 2023-05-10 MED ORDER — SIMETHICONE 80 MG PO CHEW
80.0000 mg | CHEWABLE_TABLET | ORAL | Status: DC | PRN
  Administered 2023-05-12: 80 mg via ORAL
  Filled 2023-05-10: qty 1

## 2023-05-10 MED ORDER — EPHEDRINE 5 MG/ML INJ
INTRAVENOUS | Status: AC
  Filled 2023-05-10: qty 5

## 2023-05-10 MED ORDER — MEPERIDINE HCL 25 MG/ML IJ SOLN: 6.2500 mg | INTRAMUSCULAR | Status: DC | PRN

## 2023-05-10 MED ORDER — CHLORHEXIDINE GLUCONATE 0.12 % MT SOLN: 15.0000 mL | Freq: Once | OROMUCOSAL | Status: DC

## 2023-05-10 MED ORDER — ACETAMINOPHEN 500 MG PO TABS: 1000.0000 mg | ORAL_TABLET | Freq: Four times a day (QID) | ORAL | Status: DC

## 2023-05-10 MED ORDER — DEXAMETHASONE SODIUM PHOSPHATE 10 MG/ML IJ SOLN
INTRAMUSCULAR | Status: AC
  Filled 2023-05-10: qty 1

## 2023-05-10 MED ORDER — DIPHENHYDRAMINE HCL 25 MG PO CAPS: 25.0000 mg | ORAL_CAPSULE | Freq: Four times a day (QID) | ORAL | Status: DC | PRN

## 2023-05-10 MED ORDER — ZOLPIDEM TARTRATE 5 MG PO TABS: 5.0000 mg | ORAL_TABLET | Freq: Every evening | ORAL | Status: DC | PRN

## 2023-05-10 MED ORDER — ENOXAPARIN SODIUM 40 MG/0.4ML IJ SOSY: 40.0000 mg | PREFILLED_SYRINGE | Freq: Two times a day (BID) | INTRAMUSCULAR | Status: DC

## 2023-05-10 MED ORDER — SENNOSIDES-DOCUSATE SODIUM 8.6-50 MG PO TABS
2.0000 | ORAL_TABLET | Freq: Every day | ORAL | Status: DC
  Administered 2023-05-11 – 2023-05-12 (×2): 2 via ORAL
  Filled 2023-05-10 (×2): qty 2

## 2023-05-10 MED ORDER — TRANEXAMIC ACID 1000 MG/10ML IV SOLN
INTRAVENOUS | Status: DC | PRN
  Administered 2023-05-10: 1000 mg via INTRAVENOUS

## 2023-05-10 MED ORDER — STERILE WATER FOR IRRIGATION IR SOLN
Status: DC | PRN
  Administered 2023-05-10: 1

## 2023-05-10 MED ORDER — KETOROLAC TROMETHAMINE 30 MG/ML IJ SOLN: 30.0000 mg | Freq: Four times a day (QID) | INTRAMUSCULAR | Status: DC | PRN

## 2023-05-10 MED ORDER — ONDANSETRON HCL 4 MG/2ML IJ SOLN: 4.0000 mg | Freq: Three times a day (TID) | INTRAMUSCULAR | Status: DC | PRN

## 2023-05-10 MED ORDER — ONDANSETRON HCL 4 MG/2ML IJ SOLN: 4.0000 mg | Freq: Once | INTRAMUSCULAR | Status: DC | PRN

## 2023-05-10 MED ORDER — HYDROCODONE-ACETAMINOPHEN 5-325 MG PO TABS: 1.0000 | ORAL_TABLET | Freq: Four times a day (QID) | ORAL | Status: DC | PRN

## 2023-05-10 MED ORDER — FENTANYL CITRATE (PF) 250 MCG/5ML IJ SOLN
INTRAMUSCULAR | Status: AC
  Filled 2023-05-10: qty 5

## 2023-05-10 MED ORDER — PHENYLEPHRINE HCL-NACL 20-0.9 MG/250ML-% IV SOLN
INTRAVENOUS | Status: DC | PRN
  Administered 2023-05-10: 60 ug/min via INTRAVENOUS

## 2023-05-10 MED ORDER — OXYTOCIN-SODIUM CHLORIDE 30-0.9 UT/500ML-% IV SOLN
INTRAVENOUS | Status: DC | PRN
  Administered 2023-05-10: 300 mL via INTRAVENOUS

## 2023-05-10 MED ORDER — SOD CITRATE-CITRIC ACID 500-334 MG/5ML PO SOLN: 30.0000 mL | Freq: Once | ORAL | Status: DC

## 2023-05-10 MED ORDER — ALBUTEROL SULFATE (2.5 MG/3ML) 0.083% IN NEBU: 2.5000 mg | INHALATION_SOLUTION | Freq: Four times a day (QID) | RESPIRATORY_TRACT | Status: DC | PRN

## 2023-05-10 MED ORDER — CEFAZOLIN SODIUM-DEXTROSE 2-4 GM/100ML-% IV SOLN
2.0000 g | INTRAVENOUS | Status: AC
  Administered 2023-05-10: 2 g via INTRAVENOUS

## 2023-05-10 MED ORDER — ONDANSETRON HCL 4 MG/2ML IJ SOLN
INTRAMUSCULAR | Status: DC | PRN
  Administered 2023-05-10: 4 mg via INTRAVENOUS

## 2023-05-10 MED ORDER — KETOROLAC TROMETHAMINE 30 MG/ML IJ SOLN
30.0000 mg | Freq: Four times a day (QID) | INTRAMUSCULAR | Status: DC | PRN
  Administered 2023-05-10: 30 mg via INTRAVENOUS

## 2023-05-10 MED ORDER — DIPHENHYDRAMINE HCL 50 MG/ML IJ SOLN: 12.5000 mg | INTRAMUSCULAR | Status: DC | PRN

## 2023-05-10 MED ORDER — SCOPOLAMINE 1 MG/3DAYS TD PT72
MEDICATED_PATCH | TRANSDERMAL | Status: AC
Start: 2023-05-10 — End: ?
  Filled 2023-05-10: qty 1

## 2023-05-10 MED ORDER — BUPIVACAINE IN DEXTROSE 0.75-8.25 % IT SOLN
INTRATHECAL | Status: DC | PRN
  Administered 2023-05-10: 1.6 mL via INTRATHECAL

## 2023-05-10 MED ORDER — DEXAMETHASONE SODIUM PHOSPHATE 10 MG/ML IJ SOLN
INTRAMUSCULAR | Status: DC | PRN
  Administered 2023-05-10: 10 mg via INTRAVENOUS

## 2023-05-10 MED ORDER — DIBUCAINE (PERIANAL) 1 % EX OINT: 1.0000 | TOPICAL_OINTMENT | CUTANEOUS | Status: DC | PRN

## 2023-05-10 MED ORDER — KETOROLAC TROMETHAMINE 30 MG/ML IJ SOLN
30.0000 mg | Freq: Four times a day (QID) | INTRAMUSCULAR | Status: AC
  Administered 2023-05-10 – 2023-05-11 (×4): 30 mg via INTRAVENOUS
  Filled 2023-05-10 (×4): qty 1

## 2023-05-10 MED ORDER — POVIDONE-IODINE 10 % EX SWAB
2.0000 | Freq: Once | CUTANEOUS | Status: DC
Start: 2023-05-10 — End: 2023-05-10

## 2023-05-10 MED ORDER — ORAL CARE MOUTH RINSE: 15.0000 mL | Freq: Once | OROMUCOSAL | Status: DC

## 2023-05-10 MED ORDER — MORPHINE SULFATE (PF) 0.5 MG/ML IJ SOLN
INTRAMUSCULAR | Status: DC | PRN
  Administered 2023-05-10: 150 ug via EPIDURAL

## 2023-05-10 MED ORDER — IBUPROFEN 600 MG PO TABS
600.0000 mg | ORAL_TABLET | Freq: Four times a day (QID) | ORAL | Status: DC
  Administered 2023-05-12 (×3): 600 mg via ORAL
  Filled 2023-05-10 (×3): qty 1

## 2023-05-10 MED ORDER — COCONUT OIL OIL
1.0000 | TOPICAL_OIL | Status: DC | PRN
  Administered 2023-05-11: 1 via TOPICAL

## 2023-05-10 SURGICAL SUPPLY — 36 items
CHLORAPREP W/TINT 26ML (MISCELLANEOUS) ×4 IMPLANT
CLAMP UMBILICAL CORD (MISCELLANEOUS) ×2 IMPLANT
CLOTH BEACON ORANGE TIMEOUT ST (SAFETY) ×2 IMPLANT
DERMABOND ADVANCED .7 DNX12 (GAUZE/BANDAGES/DRESSINGS) ×2 IMPLANT
DRSG OPSITE POSTOP 4X10 (GAUZE/BANDAGES/DRESSINGS) ×2 IMPLANT
ELECT REM PT RETURN 9FT ADLT (ELECTROSURGICAL) ×2
ELECTRODE REM PT RTRN 9FT ADLT (ELECTROSURGICAL) ×2 IMPLANT
EXTRACTOR VACUUM BELL STYLE (SUCTIONS) IMPLANT
GAUZE SPONGE 4X4 12PLY STRL LF (GAUZE/BANDAGES/DRESSINGS) IMPLANT
GLOVE BIOGEL PI IND STRL 6.5 (GLOVE) ×2 IMPLANT
GLOVE BIOGEL PI IND STRL 7.0 (GLOVE) ×4 IMPLANT
GLOVE SURG SS PI 6.0 STRL IVOR (GLOVE) ×2 IMPLANT
GOWN STRL REUS W/TWL LRG LVL3 (GOWN DISPOSABLE) ×4 IMPLANT
KIT ABG SYR 3ML LUER SLIP (SYRINGE) IMPLANT
LIGASURE IMPACT 36 18CM CVD LR (INSTRUMENTS) IMPLANT
MAT PREVALON FULL STRYKER (MISCELLANEOUS) IMPLANT
NDL HYPO 25X5/8 SAFETYGLIDE (NEEDLE) IMPLANT
NEEDLE HYPO 25X5/8 SAFETYGLIDE (NEEDLE)
NS IRRIG 1000ML POUR BTL (IV SOLUTION) ×2 IMPLANT
PACK C SECTION WH (CUSTOM PROCEDURE TRAY) ×2 IMPLANT
PAD ABD 8X10 STRL (GAUZE/BANDAGES/DRESSINGS) IMPLANT
PAD OB MATERNITY 4.3X12.25 (PERSONAL CARE ITEMS) ×2 IMPLANT
PENCIL SMOKE EVAC W/HOLSTER (ELECTROSURGICAL) ×2 IMPLANT
RTRCTR C-SECT PINK 25CM LRG (MISCELLANEOUS) IMPLANT
SUT PDS AB 0 CTX 36 PDP370T (SUTURE) IMPLANT
SUT PLAIN ABS 2-0 CT1 27XMFL (SUTURE) IMPLANT
SUT SILK 2 0 SH (SUTURE) IMPLANT
SUT VIC AB 0 CT1 36 (SUTURE) ×4 IMPLANT
SUT VIC AB 0 CTX36XBRD ANBCTRL (SUTURE) ×4 IMPLANT
SUT VIC AB 2-0 CT1 TAPERPNT 27 (SUTURE) ×4 IMPLANT
SUT VIC AB 4-0 KS 27 (SUTURE) ×2 IMPLANT
SUT VICRYL+ 3-0 36IN CT-1 (SUTURE) ×2 IMPLANT
TOWEL OR 17X24 6PK STRL BLUE (TOWEL DISPOSABLE) ×2 IMPLANT
TRAY FOLEY W/BAG SLVR 14FR LF (SET/KITS/TRAYS/PACK) ×2 IMPLANT
WATER STERILE IRR 1000ML POUR (IV SOLUTION) ×2 IMPLANT
YANKAUER SUCT BULB TIP NO VENT (SUCTIONS) IMPLANT

## 2023-05-10 NOTE — Lactation Note (Signed)
 This note was copied from a baby's chart. Lactation Consultation Note  Patient Name: [name] Date: 05/10/2023 Age:38 hours Reason for consult: Initial assessment;Early term 37-38.6wks;Mother's request;Maternal endocrine disorder;Breastfeeding assistance;RN request  P3- MOB/RN requested for latching assistance because infant had not latched since being born. LC assisted with placing infant on the right breast in the football hold. Infant latched and nursed for 7 minutes with some stimulation. MOB complained of contractions in her pelvic area as infant nursed. MOB reported that she breast fed her first child for 2 weeks and the baby lost too much weight. MOB stated that her milk never came in after 2 weeks. MOB also reported that her second child could not latch due to a tongue tie, but her milk did not come in despite pumping. MOB requested to be set up with a DEBP for extra stimulation due to her hx of no milk supply.   LC set up the DEBP with size 18 mm flanges. LC watched MOB pump for 15 minutes and no colostrum was collected at this time. LC informed MOB that this is nothing to worry about at this time. LC encouraged MOB to see an outpatient LC when discharged for at least the first month to follow up with her supply due to her hx. MOB agreed. LC reviewed feeding infant on cue 8-12x in 24 hrs, not allowing infant to go over 3 hrs without a feeding, CDC milk storage guidelines and LC services handout. LC encouraged MOB to call for further assistance as needed. LC informed RN of hx and that we need to keep a close eye on infant's weight/output.  Maternal Data Has patient been taught Hand Expression?: Yes Does the patient have breastfeeding experience prior to this delivery?: Yes How long did the patient breastfeed?: 2 weeks with first (but no milk came in) and 2nd child could not latch  Feeding Mother's Current Feeding Choice: Breast Milk  LATCH Score Latch: Repeated attempts  needed to sustain latch, nipple held in mouth throughout feeding, stimulation needed to elicit sucking reflex.  Audible Swallowing: A few with stimulation  Type of Nipple: Everted at rest and after stimulation  Comfort (Breast/Nipple): Soft / non-tender  Hold (Positioning): Assistance needed to correctly position infant at breast and maintain latch.  LATCH Score: 7   Lactation Tools Discussed/Used Tools: Pump;Flanges Flange Size: 18 Breast pump type: Double-Electric Breast Pump;Manual Pump Education: Setup, frequency, and cleaning;Milk Storage Reason for Pumping: hx of low/no supply Pumping frequency: 15-20 min every 3 hrs  Interventions Interventions: Breast feeding basics reviewed;Assisted with latch;Breast compression;Adjust position;Position options;Support pillows;Expressed milk;Hand pump;DEBP;Education;LC Services brochure  Discharge Discharge Education: Warning signs for feeding baby Pump: DEBP;Personal  Consult Status Consult Status: Follow-up Date: 05/11/23 Follow-up type: In-patient    Recardo Hoit BS, IBCLC 05/10/2023, 9:57 PM

## 2023-05-10 NOTE — Anesthesia Preprocedure Evaluation (Addendum)
 Anesthesia Evaluation  Patient identified by MRN, date of birth, ID band Patient awake    Reviewed: Allergy & Precautions, NPO status , Patient's Chart, lab work & pertinent test results  Airway Mallampati: II  TM Distance: >3 FB Neck ROM: Full    Dental  (+) Teeth Intact, Dental Advisory Given   Pulmonary asthma , former smoker   Pulmonary exam normal breath sounds clear to auscultation       Cardiovascular hypertension (GHTN), Normal cardiovascular exam Rhythm:Regular Rate:Normal     Neuro/Psych  Headaches, Seizures -,  PSYCHIATRIC DISORDERS Anxiety Depression     Neuromuscular disease    GI/Hepatic negative GI ROS,,,(+)     substance abuse  marijuana use  Endo/Other  diabetes, GestationalHypothyroidism  Class 3 obesity  Renal/GU negative Renal ROS     Musculoskeletal  (+) Arthritis ,  narcotic dependent  Abdominal   Peds  Hematology negative hematology ROS (+)   Anesthesia Other Findings Day of surgery medications reviewed with the patient.  Reproductive/Obstetrics (+) Pregnancy Previous c-section x 2                             Anesthesia Physical Anesthesia Plan  ASA: 3  Anesthesia Plan: Spinal   Post-op Pain Management:    Induction: Intravenous  PONV Risk Score and Plan: 2 and Scopolamine  patch - Pre-op, Dexamethasone  and Ondansetron   Airway Management Planned: Natural Airway  Additional Equipment:   Intra-op Plan:   Post-operative Plan:   Informed Consent: I have reviewed the patients History and Physical, chart, labs and discussed the procedure including the risks, benefits and alternatives for the proposed anesthesia with the patient or authorized representative who has indicated his/her understanding and acceptance.     Dental advisory given  Plan Discussed with: CRNA  Anesthesia Plan Comments:        Anesthesia Quick Evaluation

## 2023-05-10 NOTE — Transfer of Care (Signed)
 Immediate Anesthesia Transfer of Care Note  Patient: [name]  Procedure(s) Performed: CESAREAN SECTION WITH BILATERAL TUBAL LIGATION,SKIN TAG REMOVAL (Bilateral: Abdomen) EXCISION OF SKIN TAG  Patient Location: PACU  Anesthesia Type:Spinal  Level of Consciousness: awake  Airway & Oxygen Therapy: Patient Spontanous Breathing  Post-op Assessment: Report given to RN and Post -op Vital signs reviewed and stable  Post vital signs: Reviewed and stable  Last Vitals:  Vitals Value Taken Time  BP 116/63 05/10/23 1737  Temp    Pulse 68 05/10/23 1739  Resp    SpO2 99 % 05/10/23 1739  Vitals shown include unfiled device data.  Last Pain:  Vitals:   05/10/23 1203  TempSrc: Oral         Complications: No notable events documented.

## 2023-05-10 NOTE — Interval H&P Note (Signed)
 History and Physical Interval Note:  05/10/2023 2:47 PM  [name]  has presented today for surgery, with the diagnosis of repeat c-section,TUBAL.  The various methods of treatment have been discussed with the patient and family. After consideration of risks, benefits and other options for treatment, the patient has consented to  Procedure(s): CESAREAN SECTION WITH BILATERAL TUBAL LIGATION,SKIN TAG REMOVAL (Bilateral) as a surgical intervention.  The patient's history has been reviewed, patient examined, no change in status, stable for surgery.  I have reviewed the patient's chart and labs.  Questions were answered to the patient's satisfaction.  She reaffirms her desire for BTL today Has been monitoring BP at home and have been wnl. Discussed need for possible antihypertensives following delivery pending BP course     Larraine DELENA Sharps

## 2023-05-10 NOTE — Anesthesia Procedure Notes (Signed)
 Spinal  Patient location during procedure: OR Start time: 05/10/2023 4:10 PM End time: 05/10/2023 4:14 PM Reason for block: surgical anesthesia Staffing Performed: anesthesiologist  Anesthesiologist: Corinne Garnette BRAVO, MD Performed by: Corinne Garnette BRAVO, MD Authorized by: Corinne Garnette BRAVO, MD   Preanesthetic Checklist Completed: patient identified, IV checked, risks and benefits discussed, surgical consent, monitors and equipment checked, pre-op evaluation and timeout performed Spinal Block Patient position: sitting Prep: DuraPrep and site prepped and draped Patient monitoring: continuous pulse ox and blood pressure Approach: midline Location: L3-4 Injection technique: single-shot Needle Needle type: Pencan  Needle gauge: 24 G Assessment Events: CSF return Additional Notes Functioning IV was confirmed and monitors were applied. Sterile prep and drape, including hand hygiene, mask and sterile gloves were used. The patient was positioned and the spine was prepped. The skin was anesthetized with lidocaine .  Free flow of clear CSF was obtained prior to injecting local anesthetic into the CSF.  The spinal needle aspirated freely following injection.  The needle was carefully withdrawn.  The patient tolerated the procedure well. Consent was obtained prior to procedure with all questions answered and concerns addressed. Risks including but not limited to bleeding, infection, nerve damage, paralysis, failed block, inadequate analgesia, allergic reaction, high spinal, itching and headache were discussed and the patient wished to proceed.   **SPINAL PLACED BY SRNA UNDER MDA DIRECTION**  Garnette Corinne, MD

## 2023-05-10 NOTE — Anesthesia Postprocedure Evaluation (Signed)
 Anesthesia Post Note  Patient: [name]  Procedure(s) Performed: CESAREAN SECTION WITH BILATERAL TUBAL LIGATION,SKIN TAG REMOVAL (Bilateral: Abdomen) EXCISION OF SKIN TAG     Patient location during evaluation: PACU Anesthesia Type: Spinal Level of consciousness: awake, awake and alert and oriented Pain management: pain level controlled Vital Signs Assessment: post-procedure vital signs reviewed and stable Respiratory status: spontaneous breathing, nonlabored ventilation and respiratory function stable Cardiovascular status: blood pressure returned to baseline and stable Postop Assessment: no headache, no backache, spinal receding and no apparent nausea or vomiting Anesthetic complications: no   No notable events documented.  Last Vitals:  Vitals:   05/10/23 1930 05/10/23 1946  BP: 104/85 121/69  Pulse: 77 75  Resp: 18 17  Temp:  36.8 C  SpO2: 95% 97%    Last Pain:  Vitals:   05/10/23 1946  TempSrc: Oral                 Garnette FORBES Skillern

## 2023-05-10 NOTE — Op Note (Signed)
 C-Section Operative Note  Date: 05/10/23  Preoperative Diagnosis:  [redacted] weeks gestation Previous cesarean section Surgical sterilization requested Gestational hypertension Gestational diabetes Vulvar skin tags Postoperative Diagnosis: Same as pre-op Surgeon: LOIS Sharps, DO Assistant: KYM Solo, DO    An experienced assistant was required given the standard of surgical care given the complexity of the case and maternal body habitus.  This assistant was needed for exposure, dissection, suctioning, retraction, instrument exchange, assisting with delivery with administration of fundal pressure, and for overall help during the procedure.     Operative Findings: Gravid uterus. clear amniotic fluid. Viable female infant in cephalic position weighing 3560 with APGARS of 9 and 9 at 1 and 5 minutes, respectively. Normal fallopian tubes and ovaries bilaterally. Aside from fascial scarring, no evidence of adhesive disease Specimens: Placenta to L&D, right and left fallopian tube (right tube marked with suture) EBL 506 IVF 1000 UOP 50  Consent:  R/B/A of cesarean section discussed with patient. Alternative would be vaginal delivery which would mean shorter postpartum stay and decreased risk of bleeding. Risks of cesarean section include but are not limited to infection of the uterus, pelvic organs, or skin, inadvertent injury to internal organs, such as bowel or bladder, vasculature, and nerves. If there is major injury, extensive surgery may be required. If injury is minor, it may be treated with relative ease and repaired at the time of injury. Discussed possibility of excessive blood loss and transfusion. If bleeding cannot be controlled using medical or minor surgical methods, a cesarean hysterectomy may be performed which would mean no future fertility. Reviewed permanence of surgical sterilization with bilateral salpingectomy. She understands it is irreversible and future pregnancy would require in  vitro fertilization. Patient accepts the possibility of blood transfusion, if necessary. Patient understands and agrees to move forward with section.   Operative Procedure: Patient was taken to the operating room where spinal anesthesia was found to be adequate by Allis clamp test. She was prepped and draped in the normal sterile fashion in the dorsal supine position with a leftward tilt. An appropriate time out was performed. A Pfannenstiel skin incision was then made with the scalpel and carried through to the underlying layer of fascia by sharp dissection and Bovie cautery. The fascia was nicked in the midline and the incision was extended laterally with Mayo scissors. The superior aspect of the incision was grasped Coker clamps and dissected off the underlying rectus muscles. In a similar fashion the inferior aspect was dissected off the rectus muscles. Rectus muscles were separated in the midline and the peritoneal cavity entered carefully with Metzenbaum scissors. The peritoneal incision was then extended both superiorly and inferiorly with careful attention to avoid both bowel and bladder. The Alexis self-retaining wound retractor was then placed within the incision and the lower uterine segment exposed. The lower uterine segment was then incised in a curvilinear fashion and the cavity itself entered bluntly. The incision was extended bluntly. Amniotic sac was ruptured and fluid was noted to be clear in color. The infant's head was then lifted and delivered from the incision without difficulty using the standard movements. The remainder of the infant delivered and the nose and mouth bulb suctioned with the cord clamped and cut as well. The infant was handed off to the waiting pediatricians/NICU staff. The placenta was then spontaneously expressed from the uterus and the uterus cleared of all clots and debris with moist lap sponge. The uterine incision was then repaired with a running locked layer of  0-vicryl. The hysterotomy was hemostatic. Attention was then turned to the adnexa. The tubes and ovaries were inspected and the gutters cleared of all clots and debris. A bilateral salpingectomy was performed with the Ligasure device from lateral to medial after identifying the fimbriated ends. The uterine incision was inspected and found to be hemostatic. All instruments and sponges as well as the Alexis retractor were then removed from the abdomen. The fascia was then closed with 0 PDS in a running fashion. Subcutaneous tissue was reapproximated with 2-0 plain in a running fashion. The skin was closed with a subcuticular stitch of 4-0 Vicryl on a Keith needle and then reinforced with Dermabond and a Honeycomb dressing.  Attention was then turned to the vulva. The bothersome skin tags were then grasped and excised with sharp dissection with Mayo scissors. Silver  nitrate was used at the base to ensure hemostasis.  At the conclusion of the procedure all instruments and sponge counts were correct. Patient was taken to the recovery room in good condition with her baby accompanying her skin to skin.

## 2023-05-11 LAB — CBC
HCT: 29.3 % — ABNORMAL LOW (ref 36.0–46.0)
Hemoglobin: 10.3 g/dL — ABNORMAL LOW (ref 12.0–15.0)
MCH: 31 pg (ref 26.0–34.0)
MCHC: 35.2 g/dL (ref 30.0–36.0)
MCV: 88.3 fL (ref 80.0–100.0)
Platelets: 276 10*3/uL (ref 150–400)
RBC: 3.32 MIL/uL — ABNORMAL LOW (ref 3.87–5.11)
RDW: 12.6 % (ref 11.5–15.5)
WBC: 12.8 10*3/uL — ABNORMAL HIGH (ref 4.0–10.5)
nRBC: 0 % (ref 0.0–0.2)

## 2023-05-11 LAB — RPR: RPR Ser Ql: NONREACTIVE

## 2023-05-11 MED ORDER — ENOXAPARIN SODIUM 60 MG/0.6ML IJ SOSY
60.0000 mg | PREFILLED_SYRINGE | INTRAMUSCULAR | Status: DC
  Administered 2023-05-11: 60 mg via SUBCUTANEOUS
  Filled 2023-05-11: qty 0.6

## 2023-05-11 NOTE — Clinical Social Work Maternal (Signed)
CLINICAL SOCIAL WORK MATERNAL/CHILD NOTE  Patient Details  Name: [name] MRN: [id number] Date of Birth: [birth year]  Date:  05/11/2023  Clinical Social Worker Initiating Note:  Rosina Molt Date/Time: Initiated:  05/11/23/1507     Child's Name:  [name]   Biological Parents:  Mother, Father [name] 05/29/85 Stacey Canal 05-27-1983)   Need for Interpreter:  None   Reason for Referral:  Current Substance Use/Substance Use During Pregnancy     Address:  [address] [place] [address]    Phone number:  [phone] (home)     Additional phone number:   Household Members/Support Persons (HM/SP):   Household Member/Support Person 1, Household Member/Support Person 2, Household Member/Support Person 3, Household Member/Support Person 4   HM/SP Name Relationship DOB or Age  HM/SP -1 [name] MOB's son    HM/SP -2 [name] MOB's daughter    HM/SP -3 [name] FOB 05-27-1983  HM/SP -4        HM/SP -5        HM/SP -6        HM/SP -7        HM/SP -8          Natural Supports (not living in the home):  Immediate Family, Parent, Spouse/significant other   Professional Supports: None   Employment: Full-time   Type of Work: [employer]   Education:  Some College   Homebound arranged:    Surveyor, Quantity Resources:  Media Planner    Other Resources:      Cultural/Religious Considerations Which May Impact Care:    Strengths:  Home prepared for child  , Merchandiser, retail, Ability to meet basic needs  , Understanding of illness   Psychotropic Medications:         Pediatrician:    [employer] area  Pediatrician List:   [facility]  [place]    [place]    [facility]      Pediatrician Fax Number:    Risk Factors/Current Problems:  Mental Health Concerns  , Substance Use     Cognitive State:  Able to Concentrate  , Alert   , Insightful  , Goal Oriented  , Linear Thinking     Mood/Affect:  Comfortable  , Calm  , Interested  , Relaxed     CSW Assessment: CSW received a consult for THC use during pregnancy and met MOB at bedside to complete a full psychosocial assessment. CSW entered the room, introduced herself and acknowledged that she had family present. MOB gave CSW verbal permission to speak about anything while her guest were present. CSW explained her role and the reason for the visit. MOB presented as calm, was agreeable to consult and remained engaged throughout encounter  CSW collected MOB's demographic information and inquired about her mental health history. MOB reported being diagnosed with anxiety and depression when she was 38 years old. MOB reported being prescribed Zoloft and participating in therapy for support in the past. MOB reported overtime she has developed coping skills which included walking, cleaning her home and retail therapy. CSW provided education regarding the baby blues period vs. perinatal mood disorders, discussed treatment and gave resources for mental health follow up if concerns arise.  CSW recommends self-evaluation during the postpartum time period using the New Mom Checklist from Postpartum Progress and encouraged MOB to contact a medical professional if symptoms are noted at any time.  CSW assessed for safety with MOB SI/HI/DV;MOB denied all.   CSW asked MOB does she receive support resources; MOB said No(WIC and food stamps). MOB reported having all essential items for the infant including a carseat, bassinet and crib for safe sleeping. CSW provided review of Sudden Infant Death Syndrome (SIDS) precautions.   CSW informed MOB due to [facility] during her pregnancy; the hospital will perform a UDS and CDS on the infant. If the screenings return with positive results a report to CPS will be made; MOB was understanding. MOB reported THC once she found out about her pregnancy in June and  declined any use since. MOB reported prior to pregnancy she used THC on a once in a while basis and declined any/all illicit substances.  The infant's urine results were negative of all substances; CSW will continue to monitor the cord and make a CPS report if warranted.  CSW Plan/Description:  No Further Intervention Required/No Barriers to Discharge, Sudden Infant Death Syndrome (SIDS) Education, Perinatal Mood and Anxiety Disorder (PMADs) Education, Hospital Drug Screen Policy Information, Other Information/Referral to [facility], CSW Will Continue to Monitor Umbilical Cord Tissue Drug Screen Results and Make Report if Ranelle Rosina MARLA Joshua, LCSW 05/11/2023, 3:13 PM

## 2023-05-11 NOTE — Progress Notes (Signed)
 Subjective: Postpartum Day 1: Cesarean Delivery Patient reports tolerating PO and no problems voiding.  She denies HA, CP, SOB or fever. TIred and sleepy. Bonding well with baby. Has no complaints   Objective: Vital signs in last 24 hours: Temp:  [97.7 F (36.5 C)-98.3 F (36.8 C)] 97.7 F (36.5 C) (01/14 0805) Pulse Rate:  [60-115] 60 (01/14 0805) Resp:  [13-20] 17 (01/14 0805) BP: (101-129)/(63-85) 101/67 (01/14 0805) SpO2:  [95 %-99 %] 99 % (01/14 0805) Weight:  [108.8 kg] 108.8 kg (01/13 1203)  Physical Exam:  General: cooperative and no distress Lochia: appropriate Uterine Fundus: firm Incision: no significant drainage DVT Evaluation: No evidence of DVT seen on physical exam.  Recent Labs    05/10/23 1232 05/11/23 0514  HGB 12.5 10.3*  HCT 35.3* 29.3*    Assessment/Plan: Status post Cesarean section. Doing well postoperatively.  Continue current care. Monitor BP  Ted ORN Glencoe, DO 05/11/2023, 9:36 AM

## 2023-05-12 LAB — SURGICAL PATHOLOGY

## 2023-05-12 MED ORDER — IBUPROFEN 600 MG PO TABS: 600.0000 mg | ORAL_TABLET | Freq: Four times a day (QID) | ORAL | 1 refills | Status: DC | PRN

## 2023-05-12 MED ORDER — ACETAMINOPHEN 500 MG PO TABS: 1000.0000 mg | ORAL_TABLET | Freq: Four times a day (QID) | ORAL | Status: DC | PRN

## 2023-05-12 MED ORDER — PANTOPRAZOLE SODIUM 40 MG PO TBEC
40.0000 mg | DELAYED_RELEASE_TABLET | Freq: Every day | ORAL | Status: DC
  Administered 2023-05-12: 40 mg via ORAL
  Filled 2023-05-12: qty 1

## 2023-05-12 NOTE — Progress Notes (Signed)
 Subjective: Postpartum Day 2: Cesarean Delivery Patient is doing well this morning. Pain is controlled. Ambulating, voiding, tolerating PO. Minimal lochia. Breastfeeding.   Objective: Patient Vitals for the past 24 hrs:  BP Temp Temp src Pulse Resp SpO2  05/12/23 0604 111/61 98 F (36.7 C) Oral (!) 55 17 99 %  05/11/23 2009 (!) 96/58 97.9 F (36.6 C) Oral 60 18 99 %  05/11/23 1600 (!) 101/57 98 F (36.7 C) Oral 63 18 --    Physical Exam:  General: alert, cooperative, and no distress Lochia: appropriate Uterine Fundus: firm Incision: healing well, no significant drainage, no dehiscence, no significant erythema. Honeycomb in place DVT Evaluation: No evidence of DVT seen on physical exam.  Recent Labs    05/10/23 1232 05/11/23 0514  HGB 12.5 10.3*  HCT 35.3* 29.3*    Assessment/Plan: [name] [id number] POD#2 sp RCS at [redacted]w[redacted]d, GHTN 1. PPC routine PP care 2. GHTN: normotensive last 24 hours, no meds 3. Rh pos 4. Dispo: stable for discharge home, instructions reviewed.   Theodoro Fisherman 05/12/2023, 10:22 AM

## 2023-05-12 NOTE — Lactation Note (Signed)
 This note was copied from a baby's chart. Lactation Consultation Note  Patient Name: [name] [id number] Date: 05/12/2023 Age:38 hours Reason for consult: Follow-up assessment;Early term 37-38.6wks  P3, 37 wks, @ 45 hrs of life. Mom ready for discharge today. Mom shares she's done with breast feeding, she's given up. Reassured her that was fine, but lets talk about engorgement/ milk coming in. Per mom her milk won't come in- it never has. Highlighted day 5 would be the most uncomfortable, moving her milk is key to milk production. If  mom gets engorged, hand expression/ hand pump can help let off pressure of breast, take motrin , and apply ice packs for 10-20 minutes. Mom receptive, clarifies with questions. Re-enforced LC services available after DC, milk storage, and pump cleaning shared with mom.   Feeding Mother's Current Feeding Choice: Breast Milk and Formula Nipple Type: Slow - flow   Interventions Interventions: Education;LC Services brochure;CDC Guidelines for Breast Pump Cleaning (Milk Storage Guidelines)  Discharge Discharge Education: Engorgement and breast care  Consult Status Consult Status: Complete Date: 05/12/23    Firman Hughes 05/12/2023, 2:04 PM

## 2023-05-12 NOTE — Discharge Summary (Signed)
 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 yo G3P3003 at [redacted]w[redacted]d was admitted to the hospital 05/10/2023 for scheduled cesarean section with the following indication:Elective Repeat.Delivery details are as follows:  Membrane Rupture Time/Date: 4:42 PM,05/10/2023  Delivery Method:C-Section, Low Transverse Operative Delivery:N/A Details of operation can be found in separate operative note.  Patient had a postpartum course complicated by nothing.  She is ambulating, tolerating a regular diet, passing flatus, and urinating well. Patient is discharged home in stable condition on  05/12/23        Newborn Data: Birth date:05/10/2023 Birth time:4:43 PM Gender:Female Living status:Living Apgars:9 ,9  Weight:3560 g    Magnesium Sulfate received: No BMZ received: No Rhophylac:N/A MMR:N/A T-DaP:Given prenatally Flu: No RSV Vaccine received: No Transfusion:No Immunizations administered: Immunization History  Administered Date(s) Administered   Moderna Sars-Covid-2 Vaccination 07/16/2019, 08/14/2019   PPD Test 07/31/2015   Tdap 08/24/2012    Physical exam  Vitals:   05/11/23 0805 05/11/23 1600 05/11/23 2009 05/12/23 0604  BP: 101/67 (!) 101/57 (!) 96/58 111/61  Pulse: 60 63 60  (!) 55  Resp: 17 18 18 17   Temp: 97.7 F (36.5 C) 98 F (36.7 C) 97.9 F (36.6 C) 98 F (36.7 C)  TempSrc: Oral Oral Oral Oral  SpO2: 99%  99% 99%  Weight:      Height:       General: alert, cooperative, and no distress Lochia: appropriate Uterine Fundus: firm Incision: Healing well with no significant drainage DVT Evaluation: No evidence of DVT seen on physical exam. Labs: Lab Results  Component Value Date   WBC 12.8 (H) 05/11/2023   HGB 10.3 (L) 05/11/2023   HCT 29.3 (L) 05/11/2023   MCV 88.3 05/11/2023   PLT 276 05/11/2023      Latest Ref Rng & Units 05/10/2023   12:32 PM  CMP  Glucose 70 - 99 mg/dL 77   BUN 6 - 20 mg/dL 7   Creatinine 7.82 - 9.56 mg/dL 2.13   Sodium 086 - 578 mmol/L 134   Potassium 3.5 - 5.1 mmol/L 3.8   Chloride 98 - 111 mmol/L 106   CO2 22 - 32 mmol/L 18   Calcium 8.9 - 10.3 mg/dL 8.9   Total Protein 6.5 - 8.1 g/dL 6.5   Total Bilirubin 0.0 - 1.2 mg/dL 0.8   Alkaline Phos 38 - 126 U/L 150   AST 15 - 41 U/L 18   ALT 0 - 44 U/L 13    Edinburgh Score:    05/11/2023    5:06 PM  Edinburgh Postnatal Depression Scale Screening Tool  I have been able to laugh and see the funny side  of things. 0  I have looked forward with enjoyment to things. 0  I have blamed myself unnecessarily when things went wrong. 2  I have been anxious or worried for no good reason. 2  I have felt scared or panicky for no good reason. 1  Things have been getting on top of me. 1  I have been so unhappy that I have had difficulty sleeping. 1  I have felt sad or miserable. 1  I have been so unhappy that I have been crying. 1  The thought of harming myself has occurred to me. 0  Edinburgh Postnatal Depression Scale Total 9      After visit meds:  Allergies as of 05/12/2023       Reactions   Latex Rash   Oxycodone-acetaminophen  Rash   Per RN- pt can tolerate vicodin   Sulfa Antibiotics Rash, Photosensitivity   Tape Rash        Medication List     STOP  taking these medications    metFORMIN 500 MG tablet Commonly known as: GLUCOPHAGE   Semaglutide (0.25 or 0.5MG /DOS) 2 MG/1.5ML Sopn       TAKE these medications    acetaminophen  500 MG tablet Commonly known as: TYLENOL  Take 2 tablets (1,000 mg total) by mouth every 6 (six) hours as needed for mild pain (pain score 1-3) or moderate pain (pain score 4-6).   albuterol  108 (90 Base) MCG/ACT inhaler Commonly known as: VENTOLIN  HFA Inhale 2 puffs into the lungs every 6 (six) hours as needed for wheezing or shortness of breath.   cyanocobalamin  1000 MCG/ML injection Commonly known as: VITAMIN B12 Inject 1 mL (1,000 mcg total) into the muscle once a week.   cyclobenzaprine  5 MG tablet Commonly known as: FLEXERIL  Take 1 tablet (5 mg total) by mouth 3 (three) times daily as needed for muscle spasms.   diclofenac  75 MG EC tablet Commonly known as: VOLTAREN  Take 1 tablet (75 mg total) by mouth 2 (two) times daily.   diclofenac  Sodium 1 % Gel Commonly known as: VOLTAREN  Apply 2 g topically 4 (four) times daily.   hydrOXYzine  10 MG tablet Commonly known as: ATARAX  Take 0.5-1 tablets (5-10 mg total) by mouth 3 (three) times daily as needed.   ibuprofen  600 MG tablet Commonly known as: ADVIL  Take 1 tablet (600 mg total) by mouth every 6 (six) hours as needed for moderate pain (pain score 4-6) or cramping.   levothyroxine  50 MCG tablet Commonly known as: SYNTHROID  Take 1 tablet (50 mcg total) by mouth daily.         Discharge home in stable condition Infant Feeding: Breast Infant Disposition:home with mother Discharge instruction: per After Visit Summary and Postpartum booklet. Activity: Advance as tolerated. Pelvic rest for 6 weeks.  Diet: routine diet Anticipated Birth Control:  bilateral salpingectomy done during cesarean Postpartum Appointment:6 weeks Additional Postpartum F/U: Incision check 1 week Future Appointments:No future appointments. Follow up Visit:   Follow-up Information     [facility]. Schedule an appointment as soon as possible for a visit in 1 week(s).   Why: Incision check Contact information: [address]  [address] [place] [address] [phone]                     05/12/2023 Theodoro Fisherman, MD

## 2023-05-22 ENCOUNTER — Telehealth (HOSPITAL_COMMUNITY): Payer: Self-pay

## 2023-05-22 NOTE — Telephone Encounter (Signed)
05/22/2023 1850  Name: [name] MRN: [id number] DOB: 06-Jan-1986  Reason for Call:  Transition of Care Hospital Discharge Call  Contact Status: Patient Contact Status: Complete  Language assistant needed:          Follow-Up Questions: Do You Have Any Concerns About Your Health As You Heal From Delivery?: No Do You Have Any Concerns About Your Infants Health?: No  Edinburgh Postnatal Depression Scale:  In the Past 7 Days:    PHQ2-9 Depression Scale:     Discharge Follow-up: Edinburgh score requires follow up?:  (RN explained EPDS to patient. Patient states that she did EPDS with her OB at her incision check up. She states that she did well on EPDS but does not know her score. Patient declines EPDS today and states she feels she is doing well emotionally.) Patient was advised of the following resources:: Breastfeeding Support Group, Support Group  Post-discharge interventions: Reviewed Newborn Safe Sleep Practices  Signature  Signe Colt

## 2023-05-28 DIAGNOSIS — M5489 Other dorsalgia: Secondary | ICD-10-CM | POA: Diagnosis not present

## 2023-05-31 ENCOUNTER — Encounter: Payer: Self-pay | Admitting: Orthopedic Surgery

## 2023-05-31 ENCOUNTER — Ambulatory Visit (INDEPENDENT_AMBULATORY_CARE_PROVIDER_SITE_OTHER): Payer: BC Managed Care – PPO | Admitting: Orthopedic Surgery

## 2023-05-31 DIAGNOSIS — M7541 Impingement syndrome of right shoulder: Secondary | ICD-10-CM | POA: Diagnosis not present

## 2023-05-31 DIAGNOSIS — M654 Radial styloid tenosynovitis [de Quervain]: Secondary | ICD-10-CM

## 2023-05-31 MED ORDER — METHYLPREDNISOLONE ACETATE 40 MG/ML IJ SUSP
40.0000 mg | INTRAMUSCULAR | Status: AC | PRN
  Administered 2023-05-31: 40 mg via INTRA_ARTICULAR

## 2023-05-31 MED ORDER — LIDOCAINE HCL 1 % IJ SOLN
5.0000 mL | INTRAMUSCULAR | Status: AC | PRN
  Administered 2023-05-31: 5 mL

## 2023-05-31 NOTE — Progress Notes (Signed)
Office Visit Note   Patient: [name]           Date of Birth: [birth year]           MRN: [id number] Visit Date: 05/31/2023              Requested by: No referring provider defined for this encounter. PCP: Patient, No Pcp Per  Chief Complaint  Patient presents with   Right Shoulder - Pain      HPI: Patient is a 38 year old woman who presents in follow-up for impingement syndrome right shoulder.  Patient states she also has pain over the first dorsal extensor compartment right wrist.  Assessment & Plan: Visit Diagnoses:  1. Impingement syndrome of right shoulder     Plan: The right subacromial space was injected.  Recommended topical Voltaren gel for the first dorsal extensor compartment tenosynovitis.  Follow-Up Instructions: Return if symptoms worsen or fail to improve.   Ortho Exam  Patient is alert, oriented, no adenopathy, well-dressed, normal affect, normal respiratory effort. Examination of the left wrist patient is tender to palpation of the first dorsal extensor compartment.  A Finkelstein's test is positive.  Examination of the right shoulder patient has pain with Neer and Hawkins impingement test.  Imaging: No results found. No images are attached to the encounter.  Labs: Lab Results  Component Value Date   HGBA1C 5.2 11/11/2021   HGBA1C 4.9 09/20/2020   HGBA1C 5.5 04/13/2018     Lab Results  Component Value Date   ALBUMIN 2.9 (L) 05/10/2023   ALBUMIN 4.4 11/11/2021   ALBUMIN 4.6 04/13/2018    No results found for: "MG" Lab Results  Component Value Date   VD25OH 28.58 (L) 11/11/2021   VD25OH 23 (L) 09/20/2020    No results found for: "PREALBUMIN"    Latest Ref Rng & Units 05/11/2023    5:14 AM 05/10/2023   12:32 PM 11/11/2021   12:55 PM  CBC EXTENDED  WBC 4.0 - 10.5 K/uL 12.8  8.1  9.6   RBC 3.87 - 5.11 MIL/uL 3.32  3.97  4.17   Hemoglobin 12.0 - 15.0 g/dL 84.1  32.4  40.1   HCT 36.0 - 46.0 % 29.3  35.3  40.3   Platelets 150 -  400 K/uL 276  296  323.0   NEUT# 1.7 - 7.7 K/uL  5.2  6.0   Lymph# 0.7 - 4.0 K/uL  2.3  2.7      There is no height or weight on file to calculate BMI.  Orders:  No orders of the defined types were placed in this encounter.  No orders of the defined types were placed in this encounter.    Procedures: Large Joint Inj: R subacromial bursa on 05/31/2023 12:24 PM Indications: diagnostic evaluation and pain Details: 22 G 1.5 in needle, posterior approach  Arthrogram: No  Medications: 5 mL lidocaine 1 %; 40 mg methylPREDNISolone acetate 40 MG/ML Outcome: tolerated well, no immediate complications Procedure, treatment alternatives, risks and benefits explained, specific risks discussed. Consent was given by the patient. Immediately prior to procedure a time out was called to verify the correct patient, procedure, equipment, support staff and site/side marked as required. Patient was prepped and draped in the usual sterile fashion.      Clinical Data: No additional findings.  ROS:  All other systems negative, except as noted in the HPI. Review of Systems  Objective: Vital Signs: There were no vitals taken for this visit.  Specialty Comments:  No specialty comments available.  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 05/10/2023   Syncope, convulsive 10/02/2020   Tobacco dependence due to cigarettes 04/14/2018   Sciatica, left 04/13/2018   Past Medical History:  Diagnosis Date   Anxiety    Arthritis    Asthma    Back pain    low    Back problem    disc problem cervical, thoracic, lumbar    Depression    Drug abuse (HCC)    Headache(784.0)    Hypothyroidism    Migraine    Sciatic leg pain     Family History  Problem Relation Age of Onset   Diabetes Mother    Arthritis Mother    Arthritis Father    Diabetes Maternal Grandmother    Diabetes Maternal Grandfather    Asthma Son     Seizures Neg Hx     Past Surgical History:  Procedure Laterality Date   CESAREAN SECTION     CESAREAN SECTION N/A 11/16/2012   Procedure: CESAREAN SECTION;  Surgeon: Bing Plume, MD;  Location: [facility];  Service: Obstetrics;  Laterality: N/A;  REPEAT   CESAREAN SECTION WITH BILATERAL TUBAL LIGATION Bilateral 05/10/2023   Procedure: CESAREAN SECTION WITH BILATERAL TUBAL LIGATION,SKIN TAG REMOVAL;  Surgeon: Junius Creamer, DO;  Location: [facility];  Service: Obstetrics;  Laterality: Bilateral;   EXCISION OF SKIN TAG  05/10/2023   Procedure: EXCISION OF SKIN TAG;  Surgeon: Junius Creamer, DO;  Location: [facility];  Service: Obstetrics;;   IUD REMOVAL     WISDOM TOOTH EXTRACTION     WRIST SURGERY     Social History   Occupational History    Employer: [employer]  Tobacco Use   Smoking status: Former    Current packs/day: 0.50    Average packs/day: 0.5 packs/day for 13.1 years (6.5 ttl pk-yrs)    Types: Cigarettes    Start date: 2012   Smokeless tobacco: Never  Vaping Use   Vaping status: Former  Substance and Sexual Activity   Alcohol use: Not Currently    Comment: "on occasion"   Drug use: Not Currently    Types: Marijuana    Comment: "occasionally"   Sexual activity: Not on file

## 2023-06-18 DIAGNOSIS — Z8632 Personal history of gestational diabetes: Secondary | ICD-10-CM | POA: Diagnosis not present

## 2023-06-18 DIAGNOSIS — E039 Hypothyroidism, unspecified: Secondary | ICD-10-CM | POA: Diagnosis not present

## 2023-08-06 DIAGNOSIS — S86011A Strain of right Achilles tendon, initial encounter: Secondary | ICD-10-CM | POA: Diagnosis not present

## 2023-08-06 DIAGNOSIS — M79661 Pain in right lower leg: Secondary | ICD-10-CM | POA: Diagnosis not present

## 2023-08-06 DIAGNOSIS — S99911A Unspecified injury of right ankle, initial encounter: Secondary | ICD-10-CM | POA: Diagnosis not present

## 2023-08-06 DIAGNOSIS — S86111A Strain of other muscle(s) and tendon(s) of posterior muscle group at lower leg level, right leg, initial encounter: Secondary | ICD-10-CM | POA: Diagnosis not present

## 2023-08-09 DIAGNOSIS — M79604 Pain in right leg: Secondary | ICD-10-CM | POA: Diagnosis not present

## 2023-11-23 ENCOUNTER — Encounter: Payer: Self-pay | Admitting: Orthopedic Surgery

## 2023-11-23 ENCOUNTER — Ambulatory Visit (INDEPENDENT_AMBULATORY_CARE_PROVIDER_SITE_OTHER): Admitting: Orthopedic Surgery

## 2023-11-23 DIAGNOSIS — M2550 Pain in unspecified joint: Secondary | ICD-10-CM

## 2023-11-23 DIAGNOSIS — M654 Radial styloid tenosynovitis [de Quervain]: Secondary | ICD-10-CM | POA: Diagnosis not present

## 2023-11-23 DIAGNOSIS — M25511 Pain in right shoulder: Secondary | ICD-10-CM | POA: Diagnosis not present

## 2023-11-23 MED ORDER — METHYLPREDNISOLONE ACETATE 40 MG/ML IJ SUSP
40.0000 mg | INTRAMUSCULAR | Status: AC | PRN
  Administered 2023-11-23: 40 mg

## 2023-11-23 MED ORDER — LIDOCAINE HCL 1 % IJ SOLN
1.0000 mL | INTRAMUSCULAR | Status: AC | PRN
  Administered 2023-11-23: 1 mL

## 2023-11-23 NOTE — Addendum Note (Signed)
 Addended by: DESIDERIO LAYMON BROCKS on: 11/23/2023 08:29 AM   Modules accepted: Orders

## 2023-11-23 NOTE — Progress Notes (Signed)
 Office Visit Note   Patient: [name]           Date of Birth: [birth year]           MRN: [id number] Visit Date: 11/23/2023              Requested by: No referring provider defined for this encounter. PCP: Patient, No Pcp Per  Chief Complaint  Patient presents with  . 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  Patient states she has used a splint use Voltaren  gel without relief.  Assessment & Plan: Visit Diagnoses:  1. De Quervain's disease (tenosynovitis)     Plan: The first dorsal extensor compartment was injected.  Patient tolerated this well.  Will draw a rheumatologic screen.  Follow-Up Instructions: No follow-ups on file.   Ortho Exam  Patient is alert, oriented, no adenopathy, well-dressed, normal affect, normal respiratory effort. Examination patient has tenderness to palpation over the first dorsal extensor compartment.  The Finkelstein's test is positive.    Imaging: No results found. No images are attached to the encounter.  Labs: Lab Results  Component Value Date   HGBA1C 5.2 11/11/2021   HGBA1C 4.9 09/20/2020   HGBA1C 5.5 04/13/2018     Lab Results  Component Value Date   ALBUMIN 2.9 (L) 05/10/2023   ALBUMIN 4.4 11/11/2021   ALBUMIN 4.6 04/13/2018    No results found for: MG Lab Results  Component Value Date   VD25OH 28.58 (L) 11/11/2021   VD25OH 23 (L) 09/20/2020    No results found for: PREALBUMIN    Latest Ref Rng & Units 05/11/2023    5:14 AM 05/10/2023   12:32 PM 11/11/2021   12:55 PM  CBC EXTENDED  WBC 4.0 - 10.5 K/uL 12.8  8.1  9.6   RBC 3.87 - 5.11 MIL/uL 3.32  3.97  4.17   Hemoglobin 12.0 - 15.0 g/dL 89.6  87.4  86.1   HCT 36.0 - 46.0 % 29.3  35.3  40.3   Platelets 150 - 400 K/uL 276  296  323.0   NEUT# 1.7 - 7.7 K/uL  5.2  6.0   Lymph# 0.7 - 4.0 K/uL  2.3  2.7      There is no  height or weight on file to calculate BMI.  Orders:  No orders of the defined types were placed in this encounter.  No orders of the defined types were placed in this encounter.    Procedures: Hand/UE Inj: R extensor compartment 1 for de Quervain's tenosynovitis on 11/23/2023 8:20 AM Indications: therapeutic and diagnostic Details: 22 G needle Medications: 1 mL lidocaine  1 %; 40 mg methylPREDNISolone  acetate 40 MG/ML Outcome: tolerated well, no immediate complications Procedure, treatment alternatives, risks and benefits explained, specific risks discussed. Consent was given by the patient. Immediately prior to procedure a time out was called to verify the correct patient, procedure, equipment, support staff and site/side marked as required. Patient was prepped and draped in the usual sterile fashion.     Clinical Data: No additional findings.  ROS:  All other systems negative, except as noted in the HPI. Review of Systems  Objective: Vital Signs: There were no vitals taken for this visit.  Specialty Comments:  No specialty comments available.  PMFS History: Patient Active Problem List   Diagnosis Date Noted  . 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 Syncope, convulsive 10/02/2020  . Tobacco dependence due to cigarettes 04/14/2018  . Sciatica, left 04/13/2018   Past Medical History:  Diagnosis Date  . Anxiety   . Arthritis   . Asthma   . Back pain    low   . Back problem    disc problem cervical, thoracic, lumbar   . Depression   . Drug abuse (HCC)   . Headache(784.0)   . Hypothyroidism   . Migraine   . Sciatic leg pain     Family History  Problem Relation Age of Onset  . Diabetes Mother   . Arthritis Mother   . Arthritis Father   . Diabetes Maternal Grandmother   . Diabetes Maternal Grandfather   . Asthma Son   . Seizures Neg Hx     Past Surgical History:  Procedure Laterality Date  . CESAREAN SECTION    . CESAREAN  SECTION N/A 11/16/2012   Procedure: CESAREAN SECTION;  Surgeon: Debby JULIANNA Lares, MD;  Location: [facility];  Service: Obstetrics;  Laterality: N/A;  REPEAT  . CESAREAN SECTION WITH BILATERAL TUBAL LIGATION Bilateral 05/10/2023   Procedure: CESAREAN SECTION WITH BILATERAL TUBAL LIGATION,SKIN TAG REMOVAL;  Surgeon: Claudene Larraine LABOR, DO;  Location: [facility];  Service: Obstetrics;  Laterality: Bilateral;  . EXCISION OF SKIN TAG  05/10/2023   Procedure: EXCISION OF SKIN TAG;  Surgeon: Claudene Larraine LABOR, DO;  Location: [facility];  Service: Obstetrics;;  . IUD REMOVAL    . WISDOM TOOTH EXTRACTION    . WRIST SURGERY     Social History   Occupational History    Employer: [employer]  Tobacco Use  . Smoking status: Former    Current packs/day: 0.50    Average packs/day: 0.5 packs/day for 13.6 years (6.8 ttl pk-yrs)    Types: Cigarettes    Start date: 2012  . Smokeless tobacco: Never  Vaping Use  . Vaping status: Former  Substance and Sexual Activity  . Alcohol use: Not Currently    Comment: on occasion  . Drug use: Not Currently    Types: Marijuana    Comment: occasionally  . Sexual activity: Not on file

## 2023-11-24 DIAGNOSIS — B356 Tinea cruris: Secondary | ICD-10-CM | POA: Diagnosis not present

## 2023-11-24 DIAGNOSIS — Z6841 Body Mass Index (BMI) 40.0 and over, adult: Secondary | ICD-10-CM | POA: Diagnosis not present

## 2023-11-24 DIAGNOSIS — Z130 Encounter for screening for diseases of the blood and blood-forming organs and certain disorders involving the immune mechanism: Secondary | ICD-10-CM | POA: Diagnosis not present

## 2023-11-24 DIAGNOSIS — N9089 Other specified noninflammatory disorders of vulva and perineum: Secondary | ICD-10-CM | POA: Diagnosis not present

## 2023-11-24 DIAGNOSIS — Z1389 Encounter for screening for other disorder: Secondary | ICD-10-CM | POA: Diagnosis not present

## 2023-11-24 DIAGNOSIS — Z01419 Encounter for gynecological examination (general) (routine) without abnormal findings: Secondary | ICD-10-CM | POA: Diagnosis not present

## 2023-11-24 DIAGNOSIS — Z01411 Encounter for gynecological examination (general) (routine) with abnormal findings: Secondary | ICD-10-CM | POA: Diagnosis not present

## 2023-11-26 LAB — URIC ACID: Uric Acid, Serum: 5.8 mg/dL (ref 2.5–7.0)

## 2023-11-26 LAB — RHEUMATOID FACTOR: Rheumatoid fact SerPl-aCnc: <10 [IU]/mL (ref <14)

## 2023-11-26 LAB — ANA: Anti Nuclear Antibody (ANA): POSITIVE — AB

## 2023-11-26 LAB — ANTI-NUCLEAR AB-TITER (ANA TITER): ANA Titer 1: 1:40 {titer} — ABNORMAL HIGH

## 2023-11-26 LAB — C-REACTIVE PROTEIN: CRP: 4.6 mg/L (ref <8.0)

## 2023-11-26 LAB — SEDIMENTATION RATE: Sed Rate: 6 mm/h (ref 0–20)

## 2023-11-29 ENCOUNTER — Ambulatory Visit: Payer: Self-pay | Admitting: Orthopedic Surgery

## 2023-11-29 NOTE — Addendum Note (Signed)
 Addended by: DESIDERIO LAYMON BROCKS on: 11/29/2023 10:52 AM   Modules accepted: Orders

## 2023-11-29 NOTE — Telephone Encounter (Signed)
 Patient informed of results.

## 2023-11-29 NOTE — Telephone Encounter (Signed)
-----   Message from Jerona LULLA Sage sent at 11/28/2023  2:55 PM EDT ----- Make referral to rheumatology ANA positive ----- Message ----- From: Rebecka Hose Lab Results In Sent: 11/26/2023   7:32 PM EDT To: Jerona Sage LULLA, MD

## 2023-12-28 ENCOUNTER — Encounter

## 2024-02-01 NOTE — Progress Notes (Unsigned)
 Office Visit Note  Patient: [name]             Date of Birth: 01-Apr-1986           MRN: [id number]             PCP: Patient, No Pcp Per Referring: Harden Jerona GAILS, MD Visit Date: 02/04/2024 Occupation: @GUAROCC @  Subjective:  No chief complaint on file.    History of Present Illness: [name] is a 38 y.o. female ***   Activities of Daily Living:  Patient reports morning stiffness for *** {minute/hour:19697}.   Patient {ACTIONS;DENIES/REPORTS:21021675::Denies} nocturnal pain.  Difficulty dressing/grooming: {ACTIONS;DENIES/REPORTS:21021675::Denies} Difficulty climbing stairs: {ACTIONS;DENIES/REPORTS:21021675::Denies} Difficulty getting out of chair: {ACTIONS;DENIES/REPORTS:21021675::Denies} Difficulty using hands for taps, buttons, cutlery, and/or writing: {ACTIONS;DENIES/REPORTS:21021675::Denies}  No Rheumatology ROS completed.    Rheum History: # Diagnosed in ***.  Manifestation of disease:   Serologies: (+) *** (-) ***  Maintenance Labs: QuantiFERON: *** Hepatitis panel: ***  Current Treatment ***  Prior Treatments ***   PMFS History:  Patient Active Problem List   Diagnosis Date Noted   Uterine scar from previous surgery affecting pregnancy 05/10/2023   Syncope, convulsive 10/02/2020   Tobacco dependence due to cigarettes 04/14/2018   Sciatica, left 04/13/2018    Past Medical History:  Diagnosis Date   Anxiety    Arthritis    Asthma    Back pain    low    Back problem    disc problem cervical, thoracic, lumbar    Depression    Drug abuse (HCC)    Headache(784.0)    Hypothyroidism    Migraine    Sciatic leg pain     Family History  Problem Relation Age of Onset   Diabetes Mother    Arthritis Mother    Arthritis Father    Diabetes Maternal Grandmother    Diabetes Maternal Grandfather    Asthma Son    Seizures Neg Hx    Past Surgical History:  Procedure Laterality Date   CESAREAN SECTION     CESAREAN  SECTION N/A 11/16/2012   Procedure: CESAREAN SECTION;  Surgeon: Debby JULIANNA Lares, MD;  Location: [facility];  Service: Obstetrics;  Laterality: N/A;  REPEAT   CESAREAN SECTION WITH BILATERAL TUBAL LIGATION Bilateral 05/10/2023   Procedure: CESAREAN SECTION WITH BILATERAL TUBAL LIGATION,SKIN TAG REMOVAL;  Surgeon: Claudene Larraine LABOR, DO;  Location: [facility];  Service: Obstetrics;  Laterality: Bilateral;   EXCISION OF SKIN TAG  05/10/2023   Procedure: EXCISION OF SKIN TAG;  Surgeon: Claudene Larraine LABOR, DO;  Location: [facility];  Service: Obstetrics;;   IUD REMOVAL     WISDOM TOOTH EXTRACTION     WRIST SURGERY     Social History   Social History Narrative   Resides with daughter, son, boyfriend and his daughter   Right handed   Caffeine: 2-3 cups/day   Immunization History  Administered Date(s) Administered   Moderna Sars-Covid-2 Vaccination 07/16/2019, 08/14/2019   PPD Test 07/31/2015   Tdap 08/24/2012     Objective: Vital Signs: There were no vitals taken for this visit.   Physical Exam Vitals and nursing note reviewed.  HENT:     Head: Normocephalic and atraumatic.     Nose: Nose normal.  Eyes:     Conjunctiva/sclera: Conjunctivae normal.     Pupils: Pupils are equal, round, and reactive to light.  Cardiovascular:     Rate and Rhythm: Normal rate and regular rhythm.     Heart  sounds: Normal heart sounds.  Pulmonary:     Effort: Pulmonary effort is normal.     Breath sounds: Normal breath sounds.  Skin:    General: Skin is warm and dry.  Neurological:     Mental Status: She is alert. Mental status is at baseline.  Psychiatric:        Mood and Affect: Mood normal.        Behavior: Behavior normal.      Musculoskeletal Exam: ***  CDAI Exam: CDAI Score: -- Patient Global: --; Provider Global: -- Swollen: --; Tender: -- Joint Exam 02/04/2024   No joint exam has been documented for this visit   There is currently no information documented on the homunculus. Go to the  Rheumatology activity and complete the homunculus joint exam.  Investigation: No additional findings.  Imaging: No results found.  Recent Labs: Lab Results  Component Value Date   WBC 12.8 (H) 05/11/2023   HGB 10.3 (L) 05/11/2023   PLT 276 05/11/2023   NA 134 (L) 05/10/2023   K 3.8 05/10/2023   CL 106 05/10/2023   CO2 18 (L) 05/10/2023   GLUCOSE 77 05/10/2023   BUN 7 05/10/2023   CREATININE 0.76 05/10/2023   BILITOT 0.8 05/10/2023   ALKPHOS 150 (H) 05/10/2023   AST 18 05/10/2023   ALT 13 05/10/2023   PROT 6.5 05/10/2023   ALBUMIN 2.9 (L) 05/10/2023   CALCIUM 8.9 05/10/2023   GFRAA >90 11/14/2012   Lab Results  Component Value Date   ANA POSITIVE (A) 11/23/2023   RF <10 11/23/2023    Speciality Comments: No specialty comments available.  Procedures:  No procedures performed Allergies: Latex, Oxycodone-acetaminophen , Sulfa antibiotics, and Tape   Assessment / Plan:     Visit Diagnoses: No diagnosis found.  Based on our clinical evaluation and review of the patient's labs/imaging, patient has no defininte inflammatory arthritis or other compelling features for clinically active systemic autoimmune disorder serving as a unifying diagnosis for patient's overall presentation. Patient does not have any features of SLE (no objective malar rash, inflammatory arthritis, Raynaud's, alopecia, recurrent cytopenias), rheumatoid arthritis (no evidence of synovitis, negative RF/CCP), Sjogren's disease (no sicca symptoms), scleroderma (no sclerodactyly, no Raynaud's)  Discussed with patient that a positive ANA need not represent presence of clinically active systemic autoimmune disease.  Can be positive in the normal population, autoimmune thyroid  disease (Graves' disease, Hashimoto's thyroiditis etc.), or infection. ANA can be positive in the healthy population at the following rates: ANA 1:40: 20%-30%, ANA 1:80: 10%-15%, ANA 1:160: 5%, ANA 1:320: 3% positive, healthy relative of an  SLE patient: 5%-25% positive (usually low titers), and elderly (age >70 years): up to 70% positive at ANA titer 1:40.   Orders: No orders of the defined types were placed in this encounter.  No orders of the defined types were placed in this encounter.   I personally spent a total of *** minutes in the care of the patient today including {Time Based Coding:210964241}.  Follow-Up Instructions: No follow-ups on file.   Asberry Claw, DO

## 2024-02-04 ENCOUNTER — Ambulatory Visit

## 2024-02-04 ENCOUNTER — Encounter: Payer: Self-pay | Admitting: *Deleted

## 2024-02-04 ENCOUNTER — Telehealth: Payer: Self-pay

## 2024-02-04 VITALS — BP 122/82 | HR 59 | Temp 98.1°F | Resp 14 | Ht 64.0 in | Wt 233.9 lb

## 2024-02-04 DIAGNOSIS — R21 Rash and other nonspecific skin eruption: Secondary | ICD-10-CM | POA: Diagnosis not present

## 2024-02-04 DIAGNOSIS — M357 Hypermobility syndrome: Secondary | ICD-10-CM

## 2024-02-04 DIAGNOSIS — R7689 Other specified abnormal immunological findings in serum: Secondary | ICD-10-CM

## 2024-02-04 DIAGNOSIS — M797 Fibromyalgia: Secondary | ICD-10-CM

## 2024-02-04 DIAGNOSIS — R0600 Dyspnea, unspecified: Secondary | ICD-10-CM

## 2024-02-04 DIAGNOSIS — R079 Chest pain, unspecified: Secondary | ICD-10-CM | POA: Diagnosis not present

## 2024-02-04 DIAGNOSIS — E039 Hypothyroidism, unspecified: Secondary | ICD-10-CM | POA: Diagnosis not present

## 2024-02-04 NOTE — Telephone Encounter (Signed)
 Patient left a voicemail stating she just missed a call from our office.

## 2024-02-04 NOTE — Telephone Encounter (Signed)
 MyChart message sent to patient.

## 2024-02-04 NOTE — Telephone Encounter (Signed)
 Attempted to contact the patient and could not leave a message because the mailbox was full.   Patient was referred to Cardiology for Echocardiogram. Need to advise the patient to call [facility] on [address] at [phone]. Address is [address] [place], [address]th Floor.

## 2024-02-07 ENCOUNTER — Telehealth: Payer: Self-pay | Admitting: General Practice

## 2024-02-07 ENCOUNTER — Ambulatory Visit: Payer: Self-pay

## 2024-02-07 DIAGNOSIS — R7689 Other specified abnormal immunological findings in serum: Secondary | ICD-10-CM

## 2024-02-07 LAB — CELIAC DISEASE PANEL
(tTG) Ab, IgA: <1 U/mL
(tTG) Ab, IgG: <1 U/mL
Gliadin IgA: 41 U/mL — ABNORMAL HIGH
Gliadin IgG: <1 U/mL
Immunoglobulin A: 143 mg/dL (ref 47–310)

## 2024-02-07 LAB — TSH+FREE T4: TSH W/REFLEX TO FT4: 2.11 m[IU]/L

## 2024-02-07 NOTE — Telephone Encounter (Signed)
 Pt is requesting to transfer care from [place] to [place]. Please advise

## 2024-02-07 NOTE — Telephone Encounter (Unsigned)
 Copied from CRM [id number]. Topic: Appointments - Transfer of Care >> Feb 07, 2024 12:51 PM Eva FALCON wrote: Pt is requesting to transfer FROM: [facility] at [facility] Pt is requesting to transfer TO: [facility] at [place] Reason for requested transfer: States PCP left. It is the responsibility of the team the patient would like to transfer to (Dr. Jenkins Carrel) to reach out to the patient if for any reason this transfer is not acceptable.

## 2024-02-15 ENCOUNTER — Encounter: Payer: Self-pay | Admitting: *Deleted

## 2024-02-22 ENCOUNTER — Encounter: Payer: Self-pay | Admitting: *Deleted
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
## 2024-02-24 ENCOUNTER — Other Ambulatory Visit (INDEPENDENT_AMBULATORY_CARE_PROVIDER_SITE_OTHER)

## 2024-02-24 ENCOUNTER — Ambulatory Visit (INDEPENDENT_AMBULATORY_CARE_PROVIDER_SITE_OTHER): Admitting: Gastroenterology

## 2024-02-24 ENCOUNTER — Encounter: Payer: Self-pay | Admitting: Gastroenterology

## 2024-02-24 VITALS — BP 126/74 | HR 65 | Ht 60.0 in | Wt 230.2 lb

## 2024-02-24 DIAGNOSIS — Z789 Other specified health status: Secondary | ICD-10-CM

## 2024-02-24 DIAGNOSIS — R198 Other specified symptoms and signs involving the digestive system and abdomen: Secondary | ICD-10-CM

## 2024-02-24 DIAGNOSIS — R112 Nausea with vomiting, unspecified: Secondary | ICD-10-CM

## 2024-02-24 DIAGNOSIS — K219 Gastro-esophageal reflux disease without esophagitis: Secondary | ICD-10-CM | POA: Diagnosis not present

## 2024-02-24 DIAGNOSIS — R1084 Generalized abdominal pain: Secondary | ICD-10-CM | POA: Diagnosis not present

## 2024-02-24 DIAGNOSIS — R0789 Other chest pain: Secondary | ICD-10-CM

## 2024-02-24 DIAGNOSIS — R140 Abdominal distension (gaseous): Secondary | ICD-10-CM

## 2024-02-24 DIAGNOSIS — R079 Chest pain, unspecified: Secondary | ICD-10-CM

## 2024-02-24 MED ORDER — OMEPRAZOLE 40 MG PO CPDR: 40.0000 mg | DELAYED_RELEASE_CAPSULE | Freq: Every day | ORAL | 3 refills | Status: DC

## 2024-02-24 MED ORDER — IBGARD 90 MG PO CPCR
2.0000 | ORAL_CAPSULE | Freq: Two times a day (BID) | ORAL | 0 refills | Status: DC
Start: 2024-02-24 — End: 2024-02-24

## 2024-02-24 MED ORDER — IBGARD 90 MG PO CPCR: 2.0000 | ORAL_CAPSULE | Freq: Two times a day (BID) | ORAL | 0 refills | Status: DC

## 2024-02-24 NOTE — Patient Instructions (Signed)
 Your provider has requested that you go to the basement level for lab work before leaving today. Press B on the elevator. The lab is located at the first door on the left as you exit the elevator.   We have given you samples of the following medication to take: Ibgard  You have been scheduled for an abdominal ultrasound at [facility] (1st floor of hospital) on 03/02/24 at 10:30 am. Please arrive 30 minutes prior to your appointment for registration. Make certain not to have anything to eat or drink midnight prior to your appointment. Should you need to reschedule your appointment, please contact radiology at [phone]. This test typically takes about 30 minutes to perform.   Your provider has ordered Diatherix stool testing for you. You have received a kit from our office today containing all necessary supplies to complete this test. Please carefully read the stool collection instructions provided in the kit before opening the accompanying materials. In addition, be sure there is a label providing your full name and date of birth on the puritan opti-swab tube that is supplied in the kit (if you do not see a label with this information on your test tube, please make us  aware before test collection!). After completing the test, you should secure the purtian tube into the specimen biohazard bag. The [facility] E-Req sheet (including date and time of specimen collection) should be placed into the outside pocket of the specimen biohazard bag and returned to the [place] lab (basement floor of [facility]) within 3 days of collection. Please make sure to give the specimen to a staff member at the lab. DO NOT leave the specimen on the counter.   If the specimen date and time (can be found in the upper right boxed portion of the sheet) are not filled out on the E-Req sheet, the test will NOT be performed.

## 2024-02-28 ENCOUNTER — Encounter: Payer: Self-pay | Admitting: Radiology

## 2024-03-02 ENCOUNTER — Ambulatory Visit (HOSPITAL_COMMUNITY)
Admission: RE | Admit: 2024-03-02 | Discharge: 2024-03-02 | Disposition: A | Discharge status: Home / Self Care | Source: Ambulatory Visit | Attending: Gastroenterology | Admitting: Gastroenterology

## 2024-03-02 DIAGNOSIS — R161 Splenomegaly, not elsewhere classified: Secondary | ICD-10-CM | POA: Diagnosis not present

## 2024-03-02 DIAGNOSIS — R198 Other specified symptoms and signs involving the digestive system and abdomen: Secondary | ICD-10-CM | POA: Insufficient documentation

## 2024-03-02 DIAGNOSIS — R140 Abdominal distension (gaseous): Secondary | ICD-10-CM | POA: Diagnosis not present

## 2024-03-02 DIAGNOSIS — K7689 Other specified diseases of liver: Secondary | ICD-10-CM | POA: Diagnosis not present

## 2024-03-02 DIAGNOSIS — R1084 Generalized abdominal pain: Secondary | ICD-10-CM | POA: Diagnosis not present

## 2024-03-02 DIAGNOSIS — R109 Unspecified abdominal pain: Secondary | ICD-10-CM | POA: Diagnosis not present

## 2024-03-02 DIAGNOSIS — K8020 Calculus of gallbladder without cholecystitis without obstruction: Secondary | ICD-10-CM | POA: Diagnosis not present

## 2024-03-16 LAB — CELIAC AB TTG DGP TIGA

## 2024-03-16 LAB — SPECIMEN STATUS REPORT

## 2024-03-16 LAB — CELIAC HLA WITH REFLEX TO CELIAC ANTIBODIES TTG IGA, TTG IGG, DGP IGA, DGP IGG AND TOTAL IGA
DQ2 (DQA1 0501/0505,DQB1 02XX): NEGATIVE
DQ8 (DQA1 03XX,DQB1 0302): POSITIVE

## 2024-03-20 ENCOUNTER — Ambulatory Visit (HOSPITAL_COMMUNITY)
Admission: RE | Admit: 2024-03-20 | Discharge: 2024-03-20 | Disposition: A | Discharge status: Home / Self Care | Source: Ambulatory Visit | Attending: Cardiovascular Disease | Admitting: Cardiovascular Disease

## 2024-03-20 DIAGNOSIS — R079 Chest pain, unspecified: Secondary | ICD-10-CM | POA: Diagnosis not present

## 2024-03-20 DIAGNOSIS — R0600 Dyspnea, unspecified: Secondary | ICD-10-CM | POA: Insufficient documentation

## 2024-03-20 DIAGNOSIS — M357 Hypermobility syndrome: Secondary | ICD-10-CM | POA: Insufficient documentation

## 2024-03-20 LAB — ECHOCARDIOGRAM COMPLETE
Area-P 1/2: 3.13 cm2
S' Lateral: 3 cm

## 2024-03-21 ENCOUNTER — Ambulatory Visit: Payer: Self-pay | Admitting: Gastroenterology

## 2024-03-21 DIAGNOSIS — K760 Fatty (change of) liver, not elsewhere classified: Secondary | ICD-10-CM

## 2024-03-21 DIAGNOSIS — R1084 Generalized abdominal pain: Secondary | ICD-10-CM

## 2024-03-21 DIAGNOSIS — R112 Nausea with vomiting, unspecified: Secondary | ICD-10-CM

## 2024-03-21 DIAGNOSIS — K8020 Calculus of gallbladder without cholecystitis without obstruction: Secondary | ICD-10-CM

## 2024-03-21 NOTE — Progress Notes (Signed)
 Confirmation for fax received at this time.

## 2024-03-29 ENCOUNTER — Ambulatory Visit: Payer: Self-pay | Admitting: General Surgery

## 2024-03-29 DIAGNOSIS — K8050 Calculus of bile duct without cholangitis or cholecystitis without obstruction: Secondary | ICD-10-CM

## 2024-03-29 DIAGNOSIS — K8020 Calculus of gallbladder without cholecystitis without obstruction: Secondary | ICD-10-CM | POA: Diagnosis not present

## 2024-03-29 NOTE — H&P (Signed)
 REFERRING PHYSICIAN:  Arletta Camie Norris, * PROVIDER:  RICHERD SILVERSMITH, MD MRN: [id number] DOB: 1985-10-30 DATE OF ENCOUNTER: 03/29/2024 Subjective    Reason for Referral: cholelithiasis History of Present Illness: [name] is a 38 y.o. female who is seen today as an office consultation for evaluation of cholelithiasis  She has been having RUQ pain in addition to occasional LUQ and RLQ pain for several months. She had a c-section in January and states at the end of her pregnancy and since has had discomfort. Occasional nausea and emesis. Can be associated with PO and sometimes not. She is currently being worked up for celiac disease and has some positive lab results supporting this but has been on a gluten free diet since October without much change in symptoms. However, she was having diarrhea and constipation intermittently while still eating gluten and this has resolved. Her RUQ is the worst location of pain and is frequent. No fevers/chills. No changes in color of urine or stools.    Review of Systems: A complete review of systems was obtained from the patient.  I have reviewed this information and discussed as appropriate with the patient.  ROS otherwise negative except as noted in HPI.   Medical History: Past Medical History:  Diagnosis Date   Anxiety    Arthritis    Asthma, unspecified asthma severity, unspecified whether complicated, unspecified whether persistent (HHS-HCC)    Celiac disease (HHS-HCC)    De Quervain's tenosynovitis    Drug abuse (CMS-HCC)    Hypothyroidism    Lumbar back pain    Migraine     There is no problem list on file for this patient.   Past Surgical History:  Procedure Laterality Date   Cesarean section (N/A)  11/16/2012    Cesarean section with bilateral tubal ligation (Bilateral   05/10/2023   Excision of skin tag  05/10/2023   CESAREAN SECTION     Iud removal     Wisdom tooth extraction       Allergies  Allergen Reactions    Latex Hives, Dermatitis, Rash and Other (See Comments)   Sulfa (Sulfonamide Antibiotics) Photosensitivity, Dermatitis and Rash   Adhesive Rash   Adhesive Tape-Silicones Dermatitis and Rash   Oxycodone-Acetaminophen  Dermatitis and Rash    Pt can tolerate vicodin     Current Outpatient Medications on File Prior to Visit  Medication Sig Dispense Refill   ibuprofen  (MOTRIN ) 600 MG tablet as directed     levothyroxine  (SYNTHROID ) 50 MCG tablet Take 50 mcg by mouth once daily     omeprazole  (PRILOSEC) 40 MG DR capsule Take 40 mg by mouth once daily     metFORMIN (GLUCOPHAGE-XR) 500 MG XR tablet Take 500 mg by mouth once daily     No current facility-administered medications on file prior to visit.    Family History  Problem Relation Age of Onset   Arthritis Mother    Diabetes Mother    Arthritis Father    Diabetes Maternal Grandmother    Diabetes Maternal Grandfather    Asthma Son    Seizures Neg Hx      Social History   Tobacco Use  Smoking Status Former   Types: Cigarettes   Passive exposure: Past  Smokeless Tobacco Never     Social History   Socioeconomic History   Marital status: Married  Tobacco Use   Smoking status: Former    Types: Cigarettes    Passive exposure: Past   Smokeless tobacco: Never  [employer]vertising Account [employer]  Vaping status: Unknown  Substance and Sexual Activity   Alcohol use: Never   Drug use: Never   Social Drivers of Health   Food Insecurity: No Food Insecurity (05/10/2023)   Received from [facility]   Hunger Vital Sign    Within the past 12 months, you worried that your food would run out before you got the money to buy more.: Never true    Within the past 12 months, the food you bought just didn't last and you didn't have money to get more.: Never true  Transportation Needs: No Transportation Needs (05/10/2023)   Received from [facility] - Transportation    Lack of Transportation (Medical): No    Lack of Transportation (Non-Medical): No     Objective:   Vitals:   03/29/24 1352  BP: 134/87  Pulse: 77  Temp: 36.8 C (98.3 F)  SpO2: 96%  Weight: (!) 101.2 kg (223 lb 3.2 oz)  Height: 162.6 cm (5' 4)  PainSc:   4    Body mass index is 38.31 kg/m.  Physical Exam: General: No acute distress, well appearing HEENT: PERRL, hearing grossly normal, mucous membranes moist CV: Regular rate and rhythm Pulm: Normal work of breathing on room air Abd: Soft, tender to palpation in RUQ, nondistended Extremities: Warm and well perfused Neuro: A&O x4, no focal neurologic deficits Psych: Appropriate mood and effect  Labs, Imaging and Diagnostic Testing: I have personally reviewed all imaging and agree with radiologist's interpretation. RUQ US  03/02/24: Diffused echogenicity of hepatic parenchyma nonspecific indicator of hepatocellular dysfunction most commonly steatosis, mild splenomegaly, moderate cholelithiasis without signs of cholecystitis. I have personally reviewed any pertinent labs including celiac testing results, no recent LFTs. I have personally reviewed GI clinic notes from 10/30.  Assessment and Plan:     Diagnoses and all orders for this visit:  Biliary colic    - Plan to proceed with elective laparoscopic cholecystectomy with ICG - We discussed the etiology of patient's pain, we discussed treatment options and recommended surgery. We discussed surgery may not resolve all of her symptoms and still think she should consider EGD for further workup of her possible celiac diagnosis and she will require biopsies but patient would like to wait until post-op to pursue EGD. We discussed details of surgery including general anesthesia, laparoscopic approach, identification of cystic duct and common bile duct. Ligation of cystic duct and cystic artery. Possible need for intraoperative cholangiogram, open procedure, and subtotal cholecystectomy. Possible risks of common bile duct injury, injury to surrounding structures, bile  leak, bleeding, infection, diarrhea, retained stone and hernia. The patient showed good understanding and all questions were answered.   I spent a total of 61 minutes in both face-to-face and non-face-to-face activities, excluding procedures performed, for this visit on the date of this encounter.  Orie Silversmith, MD [facility]

## 2024-04-06 ENCOUNTER — Encounter (HOSPITAL_COMMUNITY): Payer: Self-pay | Admitting: General Surgery

## 2024-04-06 ENCOUNTER — Other Ambulatory Visit: Payer: Self-pay

## 2024-04-06 NOTE — Pre-Procedure Instructions (Signed)
-------------    SDW INSTRUCTIONS given:  Your procedure is scheduled on 12/12.  Report to [facility] Main Entrance A at 06:45 A.M., and check in at the Admitting office.  Any questions or running late day of surgery: call [phone]    Remember:  Do not eat or drink after midnight the night before your surgery     Take these medicines the morning of surgery with A SIP OF WATER    May take these medicines IF NEEDED: Tylenol   As of today, STOP taking any Aspirin (unless otherwise instructed by your surgeon) Aleve, Naproxen, Ibuprofen , Motrin , Advil , Goody's, BC's, all herbal medications, fish oil, and all vitamins.   Do NOT Smoke (Tobacco/Vaping) 24 hours prior to your procedure  If you use a CPAP at night, you may bring all equipment for your overnight stay.     You will be asked to remove any contacts, glasses, piercing's, hearing aid's, dentures/partials prior to surgery. Please bring cases for these items if needed.     Patients discharged the day of surgery will not be allowed to drive home, and someone needs to stay with them for 24 hours.  SURGICAL WAITING ROOM VISITATION Patients may have no more than 2 support people in the waiting area - these visitors may rotate.   Pre-op nurse will coordinate an appropriate time for 1 ADULT support person, who may not rotate, to accompany patient in pre-op.  Children under the age of 37 must have an adult with them who is not the patient and must remain in the main waiting area with an adult.  If the patient needs to stay at the hospital during part of their recovery, the visitor guidelines for inpatient rooms apply.  Please refer to the [facility] website for the visitor guidelines for any additional information.   Special instructions:   [place]- Preparing For Surgery   Please follow these instructions carefully.   Shower the NIGHT BEFORE SURGERY and the MORNING OF SURGERY with DIAL Soap.   Pat yourself dry with a  CLEAN TOWEL.  Wear CLEAN PAJAMAS to bed the night before surgery  Place CLEAN SHEETS on your bed the night of your first shower and DO NOT SLEEP WITH PETS.   Additional instructions for the day of surgery: DO NOT APPLY any lotions, deodorants, cologne, or perfumes.   Do not wear jewelry or makeup Do not wear nail polish, gel polish, artificial nails, or any other type of covering on natural nails (fingers and toes) Do not bring valuables to the hospital. [facility] is not responsible for valuables/personal belongings. Put on clean/comfortable clothes.  Please brush your teeth.  Ask your nurse before applying any prescription medications to the skin.

## 2024-04-06 NOTE — Anesthesia Preprocedure Evaluation (Signed)
 Anesthesia Evaluation  Patient identified by MRN, date of birth, ID band Patient awake    Reviewed: Allergy & Precautions, NPO status , Patient's Chart, lab work & pertinent test results  History of Anesthesia Complications Negative for: history of anesthetic complications  Airway Mallampati: II  TM Distance: >3 FB Neck ROM: Full    Dental  (+) Missing, Chipped   Pulmonary asthma , former smoker   Pulmonary exam normal        Cardiovascular negative cardio ROS Normal cardiovascular exam     Neuro/Psych  Headaches, Seizures -, Well Controlled,   Anxiety Depression       GI/Hepatic Neg liver ROS,,,BILIARY COLIC   Endo/Other  Hypothyroidism  Class 3 obesity  Renal/GU negative Renal ROS     Musculoskeletal  (+) Arthritis ,    Abdominal   Peds  Hematology negative hematology ROS (+)   Anesthesia Other Findings   Reproductive/Obstetrics                              Anesthesia Physical Anesthesia Plan  ASA: 3  Anesthesia Plan: General   Post-op Pain Management: Tylenol  PO (pre-op)* and Ketamine IV*   Induction: Intravenous  PONV Risk Score and Plan: 3 and Treatment may vary due to age or medical condition, Ondansetron , Dexamethasone , Midazolam, Scopolamine  patch - Pre-op and Propofol infusion  Airway Management Planned: Oral ETT  Additional Equipment: None  Intra-op Plan:   Post-operative Plan: Extubation in OR  Informed Consent: I have reviewed the patients History and Physical, chart, labs and discussed the procedure including the risks, benefits and alternatives for the proposed anesthesia with the patient or authorized representative who has indicated his/her understanding and acceptance.     Dental advisory given  Plan Discussed with: CRNA  Anesthesia Plan Comments:          Anesthesia Quick Evaluation

## 2024-04-06 NOTE — Progress Notes (Signed)
 PCP - denies Cardiologist - denies  PPM/ICD - denies   Chest x-ray - denies EKG - denies Stress Test - denies ECHO - 03/20/24 Cardiac Cath - denies  CPAP - denies  DM- denies  ASA/Blood Thinner Instructions: n/a   ERAS Protcol - no, NPO  COVID TEST- n/a  Anesthesia review: no  Patient verbally denies any shortness of breath, fever, cough and chest pain during phone call      Questions were answered. Patient verbalized understanding of instructions.

## 2024-04-07 ENCOUNTER — Other Ambulatory Visit (HOSPITAL_COMMUNITY): Payer: Self-pay

## 2024-04-07 ENCOUNTER — Ambulatory Visit (HOSPITAL_COMMUNITY)
Admission: RE | Admit: 2024-04-07 | Discharge: 2024-04-07 | Disposition: A | Discharge status: Home / Self Care | Attending: General Surgery | Admitting: General Surgery

## 2024-04-07 ENCOUNTER — Ambulatory Visit (HOSPITAL_COMMUNITY): Admitting: Certified Registered Nurse Anesthetist

## 2024-04-07 ENCOUNTER — Encounter (HOSPITAL_COMMUNITY): Payer: Self-pay | Admitting: General Surgery

## 2024-04-07 ENCOUNTER — Encounter: Admission: RE | Disposition: A | Payer: Self-pay | Attending: General Surgery

## 2024-04-07 DIAGNOSIS — Z87891 Personal history of nicotine dependence: Secondary | ICD-10-CM | POA: Insufficient documentation

## 2024-04-07 DIAGNOSIS — Z6841 Body Mass Index (BMI) 40.0 and over, adult: Secondary | ICD-10-CM | POA: Insufficient documentation

## 2024-04-07 DIAGNOSIS — K8050 Calculus of bile duct without cholangitis or cholecystitis without obstruction: Secondary | ICD-10-CM

## 2024-04-07 DIAGNOSIS — E039 Hypothyroidism, unspecified: Secondary | ICD-10-CM | POA: Diagnosis not present

## 2024-04-07 DIAGNOSIS — E66813 Obesity, class 3: Secondary | ICD-10-CM | POA: Diagnosis not present

## 2024-04-07 DIAGNOSIS — K824 Cholesterolosis of gallbladder: Secondary | ICD-10-CM | POA: Diagnosis not present

## 2024-04-07 DIAGNOSIS — K8010 Calculus of gallbladder with chronic cholecystitis without obstruction: Secondary | ICD-10-CM | POA: Diagnosis present

## 2024-04-07 DIAGNOSIS — K8064 Calculus of gallbladder and bile duct with chronic cholecystitis without obstruction: Secondary | ICD-10-CM | POA: Insufficient documentation

## 2024-04-07 HISTORY — PX: CHOLECYSTECTOMY: SHX55

## 2024-04-07 LAB — CBC WITH DIFFERENTIAL/PLATELET
Abs Immature Granulocytes: 0.02 K/uL (ref 0.00–0.07)
Basophils Absolute: 0 K/uL (ref 0.0–0.1)
Basophils Relative: 1 %
Eosinophils Absolute: 0.1 K/uL (ref 0.0–0.5)
Eosinophils Relative: 2 %
HCT: 38.7 % (ref 36.0–46.0)
Hemoglobin: 12.9 g/dL (ref 12.0–15.0)
Immature Granulocytes: 0 %
Lymphocytes Relative: 35 %
Lymphs Abs: 2.1 K/uL (ref 0.7–4.0)
MCH: 30.4 pg (ref 26.0–34.0)
MCHC: 33.3 g/dL (ref 30.0–36.0)
MCV: 91.1 fL (ref 80.0–100.0)
Monocytes Absolute: 0.5 K/uL (ref 0.1–1.0)
Monocytes Relative: 9 %
Neutro Abs: 3.1 K/uL (ref 1.7–7.7)
Neutrophils Relative %: 53 %
Platelets: 348 K/uL (ref 150–400)
RBC: 4.25 MIL/uL (ref 3.87–5.11)
RDW: 12.6 % (ref 11.5–15.5)
WBC: 5.8 K/uL (ref 4.0–10.5)
nRBC: 0 % (ref 0.0–0.2)

## 2024-04-07 LAB — POCT PREGNANCY, URINE: Preg Test, Ur: NEGATIVE

## 2024-04-07 LAB — COMPREHENSIVE METABOLIC PANEL WITH GFR
ALT: 25 U/L (ref 0–44)
AST: 25 U/L (ref 15–41)
Albumin: 3.9 g/dL (ref 3.5–5.0)
Alkaline Phosphatase: 70 U/L (ref 38–126)
Anion gap: 9 (ref 5–15)
BUN: 11 mg/dL (ref 6–20)
CO2: 23 mmol/L (ref 22–32)
Calcium: 9.1 mg/dL (ref 8.9–10.3)
Chloride: 105 mmol/L (ref 98–111)
Creatinine, Ser: 0.78 mg/dL (ref 0.44–1.00)
GFR, Estimated: >60 mL/min (ref >60)
Glucose, Bld: 115 mg/dL — ABNORMAL HIGH (ref 70–99)
Potassium: 3.7 mmol/L (ref 3.5–5.1)
Sodium: 137 mmol/L (ref 135–145)
Total Bilirubin: 0.8 mg/dL (ref 0.0–1.2)
Total Protein: 6.9 g/dL (ref 6.5–8.1)

## 2024-04-07 LAB — PROTIME-INR
INR: 1.2 (ref 0.8–1.2)
Prothrombin Time: 15.6 s — ABNORMAL HIGH (ref 11.4–15.2)

## 2024-04-07 LAB — LIPASE, BLOOD: Lipase: 25 U/L (ref 11–51)

## 2024-04-07 MED ORDER — ROCURONIUM BROMIDE 10 MG/ML (PF) SYRINGE
PREFILLED_SYRINGE | INTRAVENOUS | Status: DC | PRN
  Administered 2024-04-07: 10 mg via INTRAVENOUS
  Administered 2024-04-07: 60 mg via INTRAVENOUS

## 2024-04-07 MED ORDER — CHLORHEXIDINE GLUCONATE 0.12 % MT SOLN: 15.0000 mL | Freq: Once | OROMUCOSAL | Status: AC

## 2024-04-07 MED ORDER — BUPIVACAINE-EPINEPHRINE 0.25% -1:200000 IJ SOLN
INTRAMUSCULAR | Status: DC | PRN
  Administered 2024-04-07: 20 mL

## 2024-04-07 MED ORDER — LACTATED RINGERS IV SOLN: INTRAVENOUS | Status: DC

## 2024-04-07 MED ORDER — ALBUTEROL SULFATE HFA 108 (90 BASE) MCG/ACT IN AERS
INHALATION_SPRAY | RESPIRATORY_TRACT | Status: DC | PRN
  Administered 2024-04-07: 4 via RESPIRATORY_TRACT
  Administered 2024-04-07: 6 via RESPIRATORY_TRACT

## 2024-04-07 MED ORDER — DEXAMETHASONE SOD PHOSPHATE PF 10 MG/ML IJ SOLN
INTRAMUSCULAR | Status: DC | PRN
  Administered 2024-04-07: 10 mg via INTRAVENOUS

## 2024-04-07 MED ORDER — SODIUM CHLORIDE 0.9 % IR SOLN
Status: DC | PRN
  Administered 2024-04-07: 1

## 2024-04-07 MED ORDER — ENOXAPARIN SODIUM 40 MG/0.4ML IJ SOSY
40.0000 mg | PREFILLED_SYRINGE | Freq: Once | INTRAMUSCULAR | Status: AC
  Administered 2024-04-07: 40 mg via SUBCUTANEOUS
  Filled 2024-04-07: qty 0.4

## 2024-04-07 MED ORDER — TRAMADOL HCL 50 MG PO TABS
50.0000 mg | ORAL_TABLET | Freq: Four times a day (QID) | ORAL | 0 refills | Status: DC | PRN
  Filled 2024-04-07: qty 20, 5d supply, fill #0

## 2024-04-07 MED ORDER — PROPOFOL 10 MG/ML IV BOLUS
INTRAVENOUS | Status: DC | PRN
  Administered 2024-04-07: 200 mg via INTRAVENOUS

## 2024-04-07 MED ORDER — FENTANYL CITRATE (PF) 100 MCG/2ML IJ SOLN
25.0000 ug | INTRAMUSCULAR | Status: DC | PRN
  Administered 2024-04-07 (×2): 50 ug via INTRAVENOUS

## 2024-04-07 MED ORDER — 0.9 % SODIUM CHLORIDE (POUR BTL) OPTIME
TOPICAL | Status: DC | PRN
  Administered 2024-04-07: 1000 mL

## 2024-04-07 MED ORDER — SUGAMMADEX SODIUM 200 MG/2ML IV SOLN
INTRAVENOUS | Status: DC | PRN
  Administered 2024-04-07: 200 mg via INTRAVENOUS

## 2024-04-07 MED ORDER — GABAPENTIN 300 MG PO CAPS
300.0000 mg | ORAL_CAPSULE | ORAL | Status: AC
  Administered 2024-04-07: 300 mg via ORAL
  Filled 2024-04-07: qty 1

## 2024-04-07 MED ORDER — ROCURONIUM BROMIDE 10 MG/ML (PF) SYRINGE
PREFILLED_SYRINGE | INTRAVENOUS | Status: AC
  Filled 2024-04-07: qty 30

## 2024-04-07 MED ORDER — CHLORHEXIDINE GLUCONATE CLOTH 2 % EX PADS: 6.0000 | MEDICATED_PAD | Freq: Once | CUTANEOUS | Status: DC

## 2024-04-07 MED ORDER — FENTANYL CITRATE (PF) 250 MCG/5ML IJ SOLN
INTRAMUSCULAR | Status: DC | PRN
  Administered 2024-04-07: 50 ug via INTRAVENOUS
  Administered 2024-04-07: 100 ug via INTRAVENOUS
  Administered 2024-04-07 (×2): 50 ug via INTRAVENOUS

## 2024-04-07 MED ORDER — BUPIVACAINE-EPINEPHRINE (PF) 0.25% -1:200000 IJ SOLN
INTRAMUSCULAR | Status: AC
  Filled 2024-04-07: qty 30

## 2024-04-07 MED ORDER — ACETAMINOPHEN 500 MG PO TABS
1000.0000 mg | ORAL_TABLET | Freq: Once | ORAL | Status: AC
  Administered 2024-04-07: 1000 mg via ORAL
  Filled 2024-04-07: qty 2

## 2024-04-07 MED ORDER — INDOCYANINE GREEN 25 MG IJ SOLR
2.5000 mg | Freq: Once | INTRAMUSCULAR | Status: AC
  Administered 2024-04-07: 2.5 mg via INTRAVENOUS
  Filled 2024-04-07: qty 10

## 2024-04-07 MED ORDER — SCOPOLAMINE 1 MG/3DAYS TD PT72
1.0000 | MEDICATED_PATCH | TRANSDERMAL | Status: DC
  Administered 2024-04-07: 1 mg via TRANSDERMAL
  Filled 2024-04-07: qty 1

## 2024-04-07 MED ORDER — MIDAZOLAM HCL 2 MG/2ML IJ SOLN
INTRAMUSCULAR | Status: AC
  Filled 2024-04-07: qty 2

## 2024-04-07 MED ORDER — DROPERIDOL 2.5 MG/ML IJ SOLN
0.6250 mg | Freq: Once | INTRAMUSCULAR | Status: AC | PRN
  Administered 2024-04-07: 0.625 mg via INTRAVENOUS

## 2024-04-07 MED ORDER — CEFAZOLIN SODIUM-DEXTROSE 2-4 GM/100ML-% IV SOLN
2.0000 g | INTRAVENOUS | Status: AC
  Administered 2024-04-07: 2 g via INTRAVENOUS
  Filled 2024-04-07: qty 100

## 2024-04-07 MED ORDER — DEXMEDETOMIDINE HCL IN NACL 80 MCG/20ML IV SOLN
INTRAVENOUS | Status: DC | PRN
  Administered 2024-04-07: 20 ug via INTRAVENOUS

## 2024-04-07 MED ORDER — DROPERIDOL 2.5 MG/ML IJ SOLN
INTRAMUSCULAR | Status: AC
  Filled 2024-04-07: qty 2

## 2024-04-07 MED ORDER — LIDOCAINE 2% (20 MG/ML) 5 ML SYRINGE
INTRAMUSCULAR | Status: DC | PRN
  Administered 2024-04-07: 100 mg via INTRAVENOUS

## 2024-04-07 MED ORDER — FENTANYL CITRATE (PF) 100 MCG/2ML IJ SOLN
INTRAMUSCULAR | Status: AC
  Filled 2024-04-07: qty 2

## 2024-04-07 MED ORDER — ORAL CARE MOUTH RINSE: 15.0000 mL | Freq: Once | OROMUCOSAL | Status: AC

## 2024-04-07 MED ORDER — KETAMINE HCL 10 MG/ML IJ SOLN
INTRAMUSCULAR | Status: DC | PRN
  Administered 2024-04-07: 40 mg via INTRAVENOUS

## 2024-04-07 MED ORDER — KETAMINE HCL 50 MG/5ML IJ SOSY
PREFILLED_SYRINGE | INTRAMUSCULAR | Status: AC
  Filled 2024-04-07: qty 5

## 2024-04-07 MED ORDER — CHLORHEXIDINE GLUCONATE 0.12 % MT SOLN
OROMUCOSAL | Status: AC
  Administered 2024-04-07: 15 mL via OROMUCOSAL
  Filled 2024-04-07: qty 15

## 2024-04-07 MED ORDER — FENTANYL CITRATE (PF) 250 MCG/5ML IJ SOLN
INTRAMUSCULAR | Status: AC
  Filled 2024-04-07: qty 5

## 2024-04-07 MED ORDER — MIDAZOLAM HCL (PF) 2 MG/2ML IJ SOLN
INTRAMUSCULAR | Status: DC | PRN
  Administered 2024-04-07: 2 mg via INTRAVENOUS

## 2024-04-07 MED ORDER — ONDANSETRON HCL 4 MG/2ML IJ SOLN
INTRAMUSCULAR | Status: DC | PRN
  Administered 2024-04-07: 4 mg via INTRAVENOUS

## 2024-04-07 MED FILL — Propofol IV Emul 200 MG/20ML (10 MG/ML): INTRAVENOUS | Qty: 20 | Status: AC

## 2024-04-07 SURGICAL SUPPLY — 2 items
NDL 22X1.5 STRL (OR ONLY) (MISCELLANEOUS) ×1 IMPLANT
NDL INSUFFLATION 14GA 120MM (NEEDLE) ×1 IMPLANT

## 2024-04-07 NOTE — Anesthesia Procedure Notes (Signed)
 Procedure Name: Intubation Date/Time: 04/07/2024 10:15 AM  Performed by: Mannie Krystal LABOR, CRNAPre-anesthesia Checklist: Patient identified, Emergency Drugs available, Suction available and Patient being monitored Patient Re-evaluated:Patient Re-evaluated prior to induction Oxygen Delivery Method: Circle system utilized Preoxygenation: Pre-oxygenation with 100% oxygen Induction Type: IV induction Ventilation: Mask ventilation without difficulty and Oral airway inserted - appropriate to patient size Laryngoscope Size: Miller and 2 Tube type: Oral Tube size: 7.0 mm Number of attempts: 1 Airway Equipment and Method: Stylet and Oral airway Placement Confirmation: ETT inserted through vocal cords under direct vision, positive ETCO2 and breath sounds checked- equal and bilateral Secured at: 22 cm Tube secured with: Tape Dental Injury: Teeth and Oropharynx as per pre-operative assessment

## 2024-04-07 NOTE — H&P (Signed)
 HPI  [name] is an 38 y.o. female who was seen in clinic on 03/29/24 for cholelithiasis.  She has been having RUQ pain in addition to occasional LUQ and RLQ pain for several months. She had a c-section in January and states at the end of her pregnancy and since has had discomfort. Occasional nausea and emesis. Can be associated with PO and sometimes not. She is currently being worked up for celiac disease and has some positive lab results supporting this but has been on a gluten free diet since October without much change in symptoms. However, she was having diarrhea and constipation intermittently while still eating gluten and this has resolved. Her RUQ is the worst location of pain and is frequent. No fevers/chills. No changes in color of urine or stools.   10 point review of systems is negative except as listed above in HPI.  Objective  Past Medical History: Past Medical History:  Diagnosis Date   Anxiety    Arthritis    Asthma    Back pain    low    Back problem    disc problem cervical, thoracic, lumbar    Celiac disease    De Quervain's tenosynovitis    right   Depression    Drug abuse (HCC)    Headache(784.0)    Hypothyroidism    Lumbar back pain    Migraine    Rotator cuff injury    right   Sciatic leg pain     Past Surgical History: Past Surgical History:  Procedure Laterality Date   CESAREAN SECTION  2008   CESAREAN SECTION N/A 11/16/2012   Procedure: CESAREAN SECTION;  Surgeon: Debby JULIANNA Lares, MD;  Location: [facility] ORS;  Service: Obstetrics;  Laterality: N/A;  REPEAT   CESAREAN SECTION WITH BILATERAL TUBAL LIGATION Bilateral 05/10/2023   Procedure: CESAREAN SECTION WITH BILATERAL TUBAL LIGATION,SKIN TAG REMOVAL;  Surgeon: Claudene Larraine LABOR, DO;  Location: [facility] LD ORS;  Service: Obstetrics;  Laterality: Bilateral;   EXCISION OF SKIN TAG  05/10/2023   Procedure: EXCISION OF SKIN TAG;  Surgeon: Claudene Larraine LABOR, DO;  Location: [facility] LD ORS;  Service: Obstetrics;;    IUD REMOVAL     WISDOM TOOTH EXTRACTION     WRIST SURGERY      Family History:  Family History  Problem Relation Age of Onset   Diabetes Mother    Arthritis Mother    Arthritis Father    Diabetes Maternal Grandmother    Diabetes Maternal Grandfather    Asthma Son    Seizures Neg Hx     Social History:  reports that she has quit smoking. Her smoking use included cigarettes. She started smoking about 13 years ago. She has a 7 pack-year smoking history. She has been exposed to tobacco smoke. She has never used smokeless tobacco. She reports that she does not currently use alcohol. She reports that she does not currently use drugs.  Allergies: Allergies[1]  Medications: I have reviewed the patient's current medications.  Labs: Pertinent lab work personally reviewed.  Imaging: Pertinent imaging personally reviewed RUQ US  03/02/24: Diffused echogenicity of hepatic parenchyma nonspecific indicator of hepatocellular dysfunction most commonly steatosis, mild splenomegaly, moderate cholelithiasis without signs of cholecystitis.   Physical Exam Blood pressure 135/68, pulse 72, temperature 98.1 F (36.7 C), temperature source Oral, resp. rate 20, height 5' (1.524 m), weight 100.7 kg, last menstrual period 03/27/2024, SpO2 97%, not currently breastfeeding. General: No acute distress, well appearing HEENT: PERRL, hearing  grossly normal, mucous membranes moist CV: Regular rate and rhythm Pulm: Normal work of breathing on room air Abd: Soft, tender to palpation in RUQ, nondistended Extremities: Warm and well perfused Neuro: A&O x4, no focal neurologic deficits Psych: Appropriate mood and effect     Assessment   [name] is an 38 y.o. female with biliary colic  Plan  - Proceed to OR for laparoscopic cholecystectomy with ICG - We discussed the etiology of patient's pain, we discussed treatment options and recommended surgery. We discussed details of surgery including general  anesthesia, laparoscopic approach, identification of cystic duct and common bile duct. Ligation of cystic duct and cystic artery. Possible need for intraoperative cholangiogram, open procedure, and subtotal cholecystectomy. Possible risks of common bile duct injury, injury to surrounding structures, bile leak, bleeding, infection, diarrhea, retained stone and hernia. The patient showed good understanding and all questions were answered    Orie Silversmith, MD General Surgery, Surgical Critical Care and Trauma      [1]  Allergies Allergen Reactions   Latex Hives, Dermatitis, Rash and Other (See Comments)     Unknown Adhesive     Sulfa Antibiotics Photosensitivity, Dermatitis and Rash    Other Reaction(s): Unknown Substance with sulfonamide structure and antibacterial mechanism of action (substance)   Oxycodone-Acetaminophen  Rash    Per RN- pt can tolerate vicodin   Tape Rash and Dermatitis

## 2024-04-07 NOTE — Anesthesia Postprocedure Evaluation (Signed)
 Anesthesia Post Note  Patient: [name]  Procedure(s) Performed: LAPAROSCOPIC CHOLECYSTECTOMY (Abdomen)     Patient location during evaluation: PACU Anesthesia Type: General Level of consciousness: awake and alert Pain management: pain level controlled Vital Signs Assessment: post-procedure vital signs reviewed and stable Respiratory status: spontaneous breathing, nonlabored ventilation and respiratory function stable Cardiovascular status: blood pressure returned to baseline Postop Assessment: no apparent nausea or vomiting Anesthetic complications: no   No notable events documented.  Last Vitals:  Vitals:   04/07/24 1230 04/07/24 1245  BP: (!) 140/56 (!) 148/69  Pulse: 60 (!) 51  Resp: 17 16  Temp:    SpO2: 97% 100%    Last Pain:  Vitals:   04/07/24 1245  TempSrc:   PainSc: 3                  Vertell Row

## 2024-04-07 NOTE — Op Note (Signed)
 04/07/2024 9:46 AM  PATIENT: Donald PARAS Vanderberg  38 y.o. female  Patient Care Team: Patient, No Pcp Per as PCP - General (General Practice)  PRE-OPERATIVE DIAGNOSIS: laparoscopic cholecystectomy with ICG  POST-OPERATIVE DIAGNOSIS: laparoscopic cholecystectomy with ICG  PROCEDURE: laparoscopic cholecystectomy with indocyanine green  SURGEON: Orie Silversmith, MD  ASSISTANT: None  ANESTHESIA: General endotracheal  EBL: 5cc  DRAINS: None  SPECIMEN: Gallbladder  COUNTS: Sponge, needle and instrument counts were reported correct x2 at the conclusion of the operation  DISPOSITION: PACU in satisfactory condition  COMPLICATIONS: None  FINDINGS: Distended and edematous gallbladder  DESCRIPTION:  Preoperative indocyanine green was administered in preoperative holding. The patient was identified & brought into the operating room. She was then positioned supine on the OR table. SCDs were in place and active during the entire case. She then underwent general endotracheal anesthesia. Pressure points were padded. Hair on the abdomen was clipped by the OR team. The abdomen was prepped and draped in the standard sterile fashion. Antibiotics were administered. A surgical timeout was performed and confirmed our plan.  A small incision was made in the LUQ at Palmer's point and a veress needle was inserted. Air was aspirated and subsequent positive drop test. Abdomen then insufflated to . A periumbilical incision was then made and a 5mm trocar optiview using a 30 degree scope was inserted and the abdomen was entered under direct visualization. Inspection confirmed no evidence of trocar or veress site complications. The veress was then removed.   The patient was then positioned in reverse Trendelenburg with slight left side down. A 12 mm supxiphoid trocar was placed under direct visualization and  two additional 5mm trocars were placed along the right subcostal line - one 5mm port in mid subcostal  region, another 5mm port in the right flank near the anterior axillary line.  The liver and gallbladder were inspected. The gallbladder was noted to be distended, edematous. The gallbladder fundus was grasped and elevated cephalad. An additional grasper was then placed on the infundibulum of the gallbladder and the infundibulum was retracted laterally. Staying high on the gallbladder, the peritoneum on both sides of the gallbladder was opened with hook cautery. Gentle blunt dissection was then employed with a [place]  dissector working down into [employer]. The cystic duct was identified and carefully circumferentially dissected. The cystic artery was also identified and carefully circumferentially dissected. The space between the cystic artery and hepatocystic plate was developed such that a good view of the liver could be seen through a window medial to the cystic artery. The triangle of Calot had been cleared of all fibrofatty tissue. At this point, a critical view of safety was achieved and the only structures visualized was the skeletonized cystic duct laterally, the skeletonized cystic artery and the liver through the window medial to the artery. A posterior cystic artery was noted.  Attention turned to infrared fluorescent cholangiography with indocyanine green which was visualized within the common hepatic duct, common bile duct, cystic duct and small bowel.  The cystic duct and artery and posterior branch artery were clipped with 2 hemolock clips on the patient side and 1 clip on the specimen side. The cystic duct and artery were then divided. The gallbladder was then freed from its remaining attachments to the liver using electrocautery and placed into an endocatch bag. The RUQ was gently irrigated with sterile saline. Hemostasis was then verified. The clips were in good position; the gallbladder fossa was dry. The rest of the abdomen was inspected  no injury nor bleeding elsewhere was  identified.  The endocatch bag containing the gallbladder was then removed from the subxiphoid port site and passed off as specimen. The subxiphoid port fascia was then closed in a figured of eight fashion with 0 vicryl using a suture passer. The RUQ ports were removed under direct visualization and noted to be hemostatic.SABRA The fascia was palpated and noted to be completely closed. The abdomen was then desufflated and the periumbilical trocar removed. The skin of all incision sites was approximated with 4-0 monocryl subcuticular suture and dermabond applied. She was then awakened from anesthesia, extubated, and transferred to a stretcher for transport to PACU in satisfactory condition.  Instrument, sponge, and needle counts were correct at closure and at the conclusion of the case.   Orie Silversmith, MD [facility]

## 2024-04-07 NOTE — Transfer of Care (Signed)
 Immediate Anesthesia Transfer of Care Note  Patient: [name] Below  Procedure(s) Performed: LAPAROSCOPIC CHOLECYSTECTOMY (Abdomen)  Patient Location: PACU  Anesthesia Type:General  Level of Consciousness: awake, alert , and oriented  Airway & Oxygen Therapy: Patient Spontanous Breathing and Patient connected to face mask oxygen  Post-op Assessment: Report given to RN and Post -op Vital signs reviewed and stable  Post vital signs: Reviewed and stable  Last Vitals:  Vitals Value Taken Time  BP 145/80 04/07/24 12:01  Temp    Pulse 66 04/07/24 12:04  Resp 35 04/07/24 12:04  SpO2 94 % 04/07/24 12:04  Vitals shown include unfiled device data.  Last Pain:  Vitals:   04/07/24 0809  TempSrc:   PainSc: 0-No pain         Complications: No notable events documented.

## 2024-04-08 ENCOUNTER — Encounter (HOSPITAL_COMMUNITY): Payer: Self-pay | Admitting: General Surgery

## 2024-04-11 LAB — SURGICAL PATHOLOGY

## 2024-04-12 ENCOUNTER — Ambulatory Visit: Admitting: Gastroenterology

## 2024-04-12 NOTE — Progress Notes (Deleted)
 [name] [id number] 03/21/1986   Chief Complaint:  Referring Provider: No ref. provider found Primary GI MD: Dr. Shila  HPI: [name] is a 38 y.o. female with past medical history of anxiety/depression, asthma, migraines, prior C-section who presents today for a complaint of *** .    Referred by rheumatology due to finding of positive celiac serologies.  Had visit with rheumatology for positive ANA 02/04/2024, endorsed history of hypothyroidism, abnormal rash which improved since abstaining from gluten, joint hypermobility.  Father has gluten allergy. Gliadin IgA came back elevated at 41.   Note that patient has been on a gluten-free diet.  Patient initially seen in office 02/24/2024.  Endorsed abdominal bloating and distention, reflux with occasional nausea and vomiting, alternating bowel habits.  Had an EGD at age 85 and reported a history of an esophageal tear which did not require surgery.  No prior colonoscopy.  Had some family history of possible celiac disease though no one had been diagnosed.  Stated that overall her symptoms began during her most recent pregnancy, delivered her child in January and continued to have issues postpartum. EGD was discussed for further evaluation of her symptoms but she was nervous about having procedures and opted for further serologic workup initially.  She was started on a PPI, low FODMAP diet, increased dietary fiber and fiber supplementation.  Abdominal ultrasound was ordered as well.  Given samples of IBgard.  She was scheduled for echocardiogram the following month for evaluation of her chest pain.  Echocardiogram was reassuring with EF of 60 to 65%.  Genetic testing for celiac disease showed a positive DQ 8, negative DQ2.  Prior antibody testing showed a positive gliadin IgA.  Abdominal ultrasound showed probable fatty liver, mild spleen enlargement, moderate stones in the gallbladder without evidence of acute  cholecystitis. Referral to surgery was recommended, also advised to have a CBC, CMP drawn to evaluate liver enzymes. EGD was offered in further evaluation for celiac disease.  She declined to schedule and wanted to discuss further at follow-up.  Patient seen for surgical consult 03/29/2024, diagnosed with biliary colic and scheduled for laparoscopic cholecystectomy.  It was also discussed that surgery may not resolve all her symptoms and she was advised to consider EGD for further evaluation of possible celiac disease.  Patient underwent laparoscopic cholecystectomy 04/07/2024.  Findings included a distended and edematous gallbladder. 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  Labs 04/07/2024: Normal CBC, CMP, lipase, INR.  Negative pregnancy test.  She has a follow-up appointment with surgery scheduled for 05/05/2024.    Discussed the use of AI scribe software for clinical note transcription with the patient, who gave verbal consent to proceed.  History of Present Illness       Previous GI Procedures/Imaging   Abdominal ultrasound 03/06/2024 IMPRESSION: 1. Diffuse increased echogenicity of the hepatic parenchyma is a nonspecific indicator of hepatocellular dysfunction, most commonly steatosis. 2. Mild splenomegaly. 3. Moderate cholelithiasis without additional sonographic evidence of acute cholecystitis.   Past Medical History:  Diagnosis Date   Anxiety    Arthritis    Asthma    Back pain    low    Back problem    disc problem cervical, thoracic, lumbar    Celiac disease    De Quervain's tenosynovitis    right   Depression    Drug abuse (HCC)    Headache(784.0)    Hypothyroidism    Lumbar back pain    Migraine  Rotator cuff injury    right   Sciatic leg pain     Past Surgical History:  Procedure Laterality Date   CESAREAN SECTION  2008   CESAREAN SECTION N/A 11/16/2012   Procedure: CESAREAN  SECTION;  Surgeon: Debby JULIANNA Lares, MD;  Location: [facility];  Service: Obstetrics;  Laterality: N/A;  REPEAT   CESAREAN SECTION WITH BILATERAL TUBAL LIGATION Bilateral 05/10/2023   Procedure: CESAREAN SECTION WITH BILATERAL TUBAL LIGATION,SKIN TAG REMOVAL;  Surgeon: Claudene Larraine LABOR, DO;  Location: [facility];  Service: Obstetrics;  Laterality: Bilateral;   CHOLECYSTECTOMY N/A 04/07/2024   Procedure: LAPAROSCOPIC CHOLECYSTECTOMY;  Surgeon: Ann Fine, MD;  Location: [facility];  Service: General;  Laterality: N/A;  LAPAROSCOPIC CHOLECYSTECTOMY WITH ICG   EXCISION OF SKIN TAG  05/10/2023   Procedure: EXCISION OF SKIN TAG;  Surgeon: Claudene Larraine LABOR, DO;  Location: [facility];  Service: Obstetrics;;   IUD REMOVAL     WISDOM TOOTH EXTRACTION     WRIST SURGERY      Current Outpatient Medications  Medication Sig Dispense Refill   albuterol  (VENTOLIN  HFA) 108 (90 Base) MCG/ACT inhaler Inhale 2 puffs into the lungs every 6 (six) hours as needed for wheezing or shortness of breath. 8 g 2   Cholecalciferol (VITAMIN D -3) 125 MCG (5000 UT) TABS Take by mouth.     Cyanocobalamin  (VITAMIN B-12 PO) Take 1,000 mcg by mouth daily.     FT TURMERIC COMPLEX PO Take 500 mg by mouth daily. With/ Black pepper     traMADol  (ULTRAM ) 50 MG tablet Take 1 tablet (50 mg total) by mouth every 6 (six) hours as needed for severe pain (pain score 7-10). 20 tablet 0   No current facility-administered medications for this visit.    Allergies as of 04/12/2024 - Review Complete 04/07/2024  Allergen Reaction Noted   Latex Hives, Dermatitis, Rash, and Other (See Comments) 11/02/2012   Sulfa antibiotics Photosensitivity, Dermatitis, and Rash 11/02/2012   Oxycodone-acetaminophen  Rash 11/02/2012   Tape Rash and Dermatitis 04/13/2018    Family History  Problem Relation Age of Onset   Diabetes Mother    Arthritis Mother    Arthritis Father    Diabetes Maternal Grandmother    Diabetes Maternal Grandfather    Asthma Son     Seizures Neg Hx     Social History[1]   Review of Systems:    Constitutional: No weight loss, fever, chills, weakness or fatigue Eyes: No change in vision Ears, Nose, Throat:  No change in hearing or congestion Skin: No rash or itching Cardiovascular: No chest pain, chest pressure or palpitations   Respiratory: No SOB or cough Gastrointestinal: See HPI and otherwise negative Genitourinary: No dysuria or change in urinary frequency Neurological: No headache, dizziness or syncope Musculoskeletal: No new muscle or joint pain Hematologic: No bleeding or bruising    Physical Exam:  Vital signs: LMP 03/27/2024 (Exact Date)   Constitutional: NAD, Well developed, Well nourished, alert and cooperative Head:  Normocephalic and atraumatic.  Eyes: No scleral icterus. Conjunctiva pink. Mouth: No oral lesions. Respiratory: Respirations even and unlabored. Lungs clear to auscultation bilaterally.  No wheezes, crackles, or rhonchi.  Cardiovascular:  Regular rate and rhythm. No murmurs. No peripheral edema. Gastrointestinal:  Soft, nondistended, nontender. No rebound or guarding. Normal bowel sounds. No appreciable masses or hepatomegaly. Rectal:  Not performed.  Neurologic:  Alert and oriented x4;  grossly normal neurologically.  Skin:   Dry and intact without significant lesions or rashes. Psychiatric:  Oriented to person, place and time. Demonstrates good judgement and reason without abnormal affect or behaviors.   RELEVANT LABS AND IMAGING: CBC    Component Value Date/Time   WBC 5.8 04/07/2024 0740   RBC 4.25 04/07/2024 0740   HGB 12.9 04/07/2024 0740   HCT 38.7 04/07/2024 0740   PLT 348 04/07/2024 0740   MCV 91.1 04/07/2024 0740   MCH 30.4 04/07/2024 0740   MCHC 33.3 04/07/2024 0740   RDW 12.6 04/07/2024 0740   LYMPHSABS 2.1 04/07/2024 0740   MONOABS 0.5 04/07/2024 0740   EOSABS 0.1 04/07/2024 0740   BASOSABS 0.0 04/07/2024 0740    CMP     Component Value Date/Time   NA  137 04/07/2024 0740   K 3.7 04/07/2024 0740   CL 105 04/07/2024 0740   CO2 23 04/07/2024 0740   GLUCOSE 115 (H) 04/07/2024 0740   BUN 11 04/07/2024 0740   CREATININE 0.78 04/07/2024 0740   CREATININE 0.92 09/20/2020 1553   CALCIUM 9.1 04/07/2024 0740   PROT 6.9 04/07/2024 0740   ALBUMIN 3.9 04/07/2024 0740   AST 25 04/07/2024 0740   ALT 25 04/07/2024 0740   ALKPHOS 70 04/07/2024 0740   BILITOT 0.8 04/07/2024 0740   GFRNONAA >60 04/07/2024 0740   GFRAA >90 11/14/2012 1115   Echocardiogram 03/20/2024 1. Left ventricular ejection fraction, by estimation, is 60 to 65% . The left ventricle has normal function. The left ventricle has no regional wall motion abnormalities. Left ventricular diastolic parameters were normal.  2. Right ventricular systolic function is normal. The right ventricular size is normal. There is normal pulmonary artery systolic pressure. The estimated right ventricular systolic pressure is 24. 0 mmHg.  3. The mitral valve is normal in structure. No evidence of mitral valve regurgitation. No evidence of mitral stenosis.  4. The aortic valve is tricuspid. Aortic valve regurgitation is not visualized. Aortic valve sclerosis is present, with no evidence of aortic valve stenosis.  5. The inferior vena cava is normal in size with greater than 50% respiratory variability, suggesting right atrial pressure of 3 mmHg.   Assessment/Plan:   Assessment & Plan     Fibrosis 4 Score = .55 (Low risk)        Interpretation for patients with NAFLD          <1.30       -  F0-F1 (Low risk)          1.30-2.67 -  Indeterminate           >2.67      -  F3-F4 (High risk)     Validated for ages 49-65            Camie Furbish, PA-C  Gastroenterology 04/12/2024, 11:13 AM  Patient Care Team: Patient, No Pcp Per as PCP - General (General Practice)      [1]  Social History Tobacco Use   Smoking status: Former    Current packs/day: 0.50    Average packs/day: 0.5 packs/day  for 14.0 years (7.0 ttl pk-yrs)    Types: Cigarettes    Start date: 2012    Passive exposure: Past   Smokeless tobacco: Never  Vaping Use   Vaping status: Never Used  Substance Use Topics   Alcohol use: Not Currently    Comment: on occasion   Drug use: Not Currently

## 2024-04-14 NOTE — Telephone Encounter (Signed)
 Second attempt, please advise on TOC approval

## 2024-05-05 ENCOUNTER — Encounter: Payer: Self-pay | Admitting: General Surgery

## 2024-05-08 ENCOUNTER — Telehealth: Payer: Self-pay | Admitting: General Practice

## 2024-05-08 NOTE — Telephone Encounter (Signed)
 Patient requests  TOC from Dr. Kip to Dr. Wendolyn.  Please advise.

## 2024-05-25 ENCOUNTER — Ambulatory Visit (INDEPENDENT_AMBULATORY_CARE_PROVIDER_SITE_OTHER): Admitting: Family Medicine

## 2024-05-25 VITALS — BP 124/82 | HR 76 | Temp 98.1°F | Ht 60.0 in | Wt 227.2 lb

## 2024-05-25 DIAGNOSIS — F411 Generalized anxiety disorder: Secondary | ICD-10-CM | POA: Insufficient documentation

## 2024-05-25 DIAGNOSIS — R6889 Other general symptoms and signs: Secondary | ICD-10-CM

## 2024-05-25 DIAGNOSIS — Z8632 Personal history of gestational diabetes: Secondary | ICD-10-CM | POA: Insufficient documentation

## 2024-05-25 DIAGNOSIS — K900 Celiac disease: Secondary | ICD-10-CM | POA: Insufficient documentation

## 2024-05-25 DIAGNOSIS — F422 Mixed obsessional thoughts and acts: Secondary | ICD-10-CM | POA: Diagnosis not present

## 2024-05-25 MED ORDER — DESVENLAFAXINE SUCCINATE ER 50 MG PO TB24: 50.0000 mg | ORAL_TABLET | Freq: Every day | ORAL | 1 refills | Status: AC

## 2024-05-25 NOTE — Progress Notes (Signed)
 "  Subjective:     Patient ID: [name], female    DOB: 01/23/1986, 39 y.o.   MRN: [id number]  Chief Complaint  Patient presents with   Stress    Discussed the use of AI scribe software for clinical note transcription with the patient, who gave verbal consent to proceed.  History of Present Illness [name] is a 39 year old female who presents with concerns about weight fluctuations/stress.  TOC from [place]. [facility] White's daughter  She experiences significant weight fluctuations, ranging from 206 to 226 pounds within a week, despite adhering to a gluten-free diet due to celiac disease and engaging in regular physical activity, including walking over 14,000 steps a day and running in the mornings. She attributes some of her weight changes to stress eating, particularly in the evenings. She has a history of gestational diabetes and has had her gallbladder removed.  She has a history of asthma, managed with albuterol  as needed for asthmatic bronchitis. She has not experienced migraines recently, though she previously had migraines with auras. She reports that her thyroid  'goes from normal to off' and that she has done a lot of research on it, but her most recent TSH panel after her last pregnancy was normal.  She experiences severe social anxiety and OCD, which impact her daily life, including stress eating and compulsive behaviors. She has a history of depression and anxiety, previously managed with Zoloft, but discontinued due to ineffectiveness after many years. She is concerned about becoming dependent on medication but is open to treatment to improve her mental health. NO SI  Her family history includes arthritis and diabetes in her mother, arthritis in her father, and asthma in her son. She has a history of smoking and drinking but has quit both. She is remarried and has four children at home.  She works as a doctor, general practice in a school system and is in the process of  changing jobs. She reports a high level of physical activity at work, contributing to her daily exercise routine.    Health Maintenance Due  Topic Date Due   Cervical Cancer Screening (HPV/Pap Cotest)  Never done    Past Medical History:  Diagnosis Date   Anxiety    Arthritis    Asthma    Back pain    low    Back problem    disc problem cervical, thoracic, lumbar    Celiac disease    De Quervain's tenosynovitis    right   Depression    Drug abuse (HCC)    Headache(784.0)    History of gestational diabetes    Hypothyroidism    Lumbar back pain    Migraine    auras   Rotator cuff injury    right   Sciatic leg pain     Past Surgical History:  Procedure Laterality Date   CESAREAN SECTION  2008   CESAREAN SECTION N/A 11/16/2012   Procedure: CESAREAN SECTION;  Surgeon: Debby JULIANNA Lares, MD;  Location: [facility];  Service: Obstetrics;  Laterality: N/A;  REPEAT   CESAREAN SECTION WITH BILATERAL TUBAL LIGATION Bilateral 05/10/2023   Procedure: CESAREAN SECTION WITH BILATERAL TUBAL LIGATION,SKIN TAG REMOVAL;  Surgeon: Claudene Larraine LABOR, DO;  Location: [facility];  Service: Obstetrics;  Laterality: Bilateral;   CHOLECYSTECTOMY N/A 04/07/2024   Procedure: LAPAROSCOPIC CHOLECYSTECTOMY;  Surgeon: Kayanna Mckillop Fine, MD;  Location: [facility];  Service: General;  Laterality: N/A;  LAPAROSCOPIC CHOLECYSTECTOMY WITH ICG   EXCISION OF SKIN TAG  05/10/2023   Procedure: EXCISION OF SKIN TAG;  Surgeon: Claudene Larraine LABOR, DO;  Location: [facility];  Service: Obstetrics;;   IUD REMOVAL     WISDOM TOOTH EXTRACTION     WRIST SURGERY Left     Current Medications[1]  Allergies[2] ROS neg/noncontributory except as noted HPI/below      Objective:     BP 124/82 (BP Location: Left Arm, Patient Position: Sitting, Cuff Size: Normal)   Pulse 76   Temp 98.1 F (36.7 C) (Temporal)   Ht 5' (1.524 m)   Wt 227 lb 4 oz (103.1 kg)   LMP 05/20/2024   SpO2 98%   Breastfeeding No   BMI 44.38 kg/m  Wt  Readings from Last 3 Encounters:  05/25/24 227 lb 4 oz (103.1 kg)  04/07/24 222 lb (100.7 kg)  02/24/24 230 lb 4 oz (104.4 kg)    Physical Exam MEASUREMENTS: Weight- 226-206. GENERAL: Well developed, well nourished, no acute distress. HEAD EYES EARS NOSE THROAT: Normocephalic, atraumatic, conjunctiva not injected, sclera nonicteric. CARDIAC: Regular rate and rhythm, S1 S2 present, no murmur. NECK: Supple, no thyromegaly, no lymphadenopathy, no carotid bruits. LUNGS: Clear to auscultation bilaterally, no wheezes. ABDOMEN: Bowel sounds present, soft, non-tender, non-distended, no hepatosplenomegaly, no masses. EXTREMITIES: No edema. MUSCULOSKELETAL: No gross abnormalities. NEUROLOGICAL: Alert and oriented x3, cranial nerves II through XII intact. PSYCHIATRIC: Normal mood, good eye contact. Tearful at times   I personally spent a total of 50 minutes in the care of the patient today including preparing to see the patient, getting/reviewing separately obtained history, performing a medically appropriate exam/evaluation, counseling and educating, placing orders, and documenting clinical information in the EHR.     Assessment & Plan:  GAD (generalized anxiety disorder)  Mixed obsessional thoughts and acts  Celiac disease  Fluctuation of weight  History of gestational diabetes  Other orders -     Desvenlafaxine  Succinate ER; Take 1 tablet (50 mg total) by mouth daily.  Dispense: 30 tablet; Refill: 1    Assessment and Plan Assessment & Plan Obsessive-compulsive disorder and generalized anxiety disorder   Chronic OCD and generalized anxiety disorder significantly impact her daily life, causing stress eating and difficulty managing household tasks. Zoloft was discontinued due to perceived ineffectiveness. Concerns about medication dependency were addressed, highlighting the importance of correcting brain chemical imbalances. Pristiq  (desvenlafaxine ) was chosen for its dual action on  serotonin and norepinephrine, potentially aiding in anxiety and OCD symptoms. Prescribe Pristiq  once daily, with timing adjustments based on drowsiness. A follow-up video visit is scheduled in one month to assess medication response and adjust treatment as necessary.  Celiac disease   Managed with a gluten-free diet, she reports symptom improvement and is hesitant to undergo an endoscopy for biopsy confirmation due to concerns about scar tissue. Blood work and genetic testing support the diagnosis, and she manages well with dietary modifications. Continue the gluten-free diet.   Obesity and weight management   She experiences significant weight fluctuations, ranging from 206 to 226 pounds, and is physically active, walking over 14,000 steps daily and running in the mornings. Stress eating and irregular eating patterns contribute to weight instability. Interested in natural weight management strategies, she is cautious about medication options like Ozempic . Emphasize regular meals to stabilize metabolism and reduce stress eating. Implement a structured eating plan with regular meals, starting with a protein-rich breakfast. Encourage continued physical activity, including running and varied exercises like walking backwards or interval training. Consider referral to a nutritionist or weight management program  if current strategies do not yield desired results.  Fibromyalgia   Chronic fibromyalgia with associated pain and fatigue is managed through exercise and lifestyle modifications. Pristiq  may provide additional benefit for pain management due to its action on norepinephrine. Continue current exercise regimen and lifestyle modifications. Monitor response to Pristiq  for potential improvement in fibromyalgia symptoms.  Asthma   Managed with albuterol  as needed, with no acute exacerbations reported.  H/o gest DM-needs to continue working on diet/exercise.  Will monitor     Return in about 4 weeks  (around 06/22/2024) for mood-video.  Jenkins CHRISTELLA Carrel, MD     [1]  Current Outpatient Medications:    albuterol  (VENTOLIN  HFA) 108 (90 Base) MCG/ACT inhaler, Inhale 2 puffs into the lungs every 6 (six) hours as needed for wheezing or shortness of breath., Disp: 8 g, Rfl: 2   Cholecalciferol (VITAMIN D -3) 125 MCG (5000 UT) TABS, Take by mouth., Disp: , Rfl:    Cyanocobalamin  (VITAMIN B-12 PO), Take 1,000 mcg by mouth daily., Disp: , Rfl:    desvenlafaxine  (PRISTIQ ) 50 MG 24 hr tablet, Take 1 tablet (50 mg total) by mouth daily., Disp: 30 tablet, Rfl: 1   FT TURMERIC COMPLEX PO, Take 500 mg by mouth daily. With/ Black pepper, Disp: , Rfl:  [2]  Allergies Allergen Reactions   Fentanyl      Patient very adverse to taking stating it deeply affects her mental and emotional state   Latex Hives, Dermatitis, Rash and Other (See Comments)     Unknown Adhesive     Sulfa Antibiotics Photosensitivity, Dermatitis and Rash    Other Reaction(s): Unknown Substance with sulfonamide structure and antibacterial mechanism of action (substance)   Oxycodone-Acetaminophen  Rash    Per RN- pt can tolerate vicodin   Tape Rash and Dermatitis   "

## 2024-05-25 NOTE — Patient Instructions (Signed)
 Welcome to [facility]! It was a pleasure meeting you today.  As discussed, Please schedule a 1 month follow up visit today.  PLEASE NOTE:  If you had any LAB tests please let us know if you have not heard back within a few days. You may see your results on MyChart before we have a chance to review them but we will give you a call once they are reviewed by [place]. If we ordered any REFERRALS today, please let us know if you have not heard from their office within the next week.  Let us know through MyChart if you are needing REFILLS, or have your pharmacy send [place] the request. You can also use MyChart to communicate with me or any office staff.  Please try these tips to maintain a healthy lifestyle:  Eat most of your calories during the day when you are active. Eliminate processed foods including packaged sweets (pies, cakes, cookies), reduce intake of potatoes, white bread, white pasta, and white rice. Look for whole grain options, oat flour or almond flour.  Each meal should contain half fruits/vegetables, one quarter protein, and one quarter carbs (no bigger than a computer mouse).  Cut down on sweet beverages. This includes juice, soda, and sweet tea. Also watch fruit intake, though this is a healthier sweet option, it still contains natural sugar! Limit to 3 servings daily.  Drink at least 1 glass of water with each meal and aim for at least 8 glasses per day  Exercise at least 150 minutes every week.

## 2024-05-26 ENCOUNTER — Telehealth: Payer: Self-pay

## 2024-05-26 ENCOUNTER — Other Ambulatory Visit (HOSPITAL_COMMUNITY): Payer: Self-pay

## 2024-05-26 NOTE — Telephone Encounter (Signed)
 Pharmacy Patient Advocate Encounter   Received notification from [employer] that prior authorization for Desvenlafaxine  Succinate ER 50MG  er tablets is required/requested.   Insurance verification completed.   The patient is insured through [employer].   Per test claim: PA required; PA submitted to above mentioned insurance via Latent Key/confirmation #/EOC [id number] Status is pending

## 2024-05-29 ENCOUNTER — Other Ambulatory Visit (HOSPITAL_COMMUNITY): Payer: Self-pay

## 2024-05-29 NOTE — Telephone Encounter (Signed)
 Pharmacy Patient Advocate Encounter  Received notification from [facility] that Prior Authorization for Desvenlafaxine  Succinate ER 50MG  er tablets  has been APPROVED from 04/26/24 to 05/26/25. Unable to obtain price due to refill too soon rejection, last fill date 05/26/24 next available fill date 06/17/24   PA #/Case ID/Reference #: [id number]

## 2024-06-01 ENCOUNTER — Telehealth: Payer: Self-pay | Admitting: Gastroenterology

## 2024-06-01 NOTE — Telephone Encounter (Signed)
 Please see if we can get patient in for follow up appointment. Would like to follow up on her ultrasound findings of fatty liver and mild splenomegaly. Recommend further testing and FibroScan to evaluate for liver fibrosis.  If her symptoms have persisted after her cholecystectomy we can also consider EGD.

## 2024-06-02 NOTE — Telephone Encounter (Signed)
 Spoke with patient and scheduled follow-up appointment for 06/28/2024 at 330.

## 2024-06-22 ENCOUNTER — Ambulatory Visit: Admitting: Family Medicine

## 2024-06-28 ENCOUNTER — Ambulatory Visit: Admitting: Gastroenterology
